# Patient Record
Sex: Female | Born: 1935 | Race: Black or African American | Hispanic: No | Smoking: Never smoker
Health system: Southern US, Community
[De-identification: ages and names within clinical notes are randomized; demographics above are authoritative.]

## PROBLEM LIST (undated history)

## (undated) DIAGNOSIS — D649 Anemia, unspecified: Secondary | ICD-10-CM

## (undated) DIAGNOSIS — I10 Essential (primary) hypertension: Secondary | ICD-10-CM

## (undated) DIAGNOSIS — E7800 Pure hypercholesterolemia, unspecified: Secondary | ICD-10-CM

## (undated) DIAGNOSIS — E119 Type 2 diabetes mellitus without complications: Secondary | ICD-10-CM

## (undated) DIAGNOSIS — Z923 Personal history of irradiation: Secondary | ICD-10-CM

## (undated) DIAGNOSIS — H269 Unspecified cataract: Secondary | ICD-10-CM

## (undated) DIAGNOSIS — C50919 Malignant neoplasm of unspecified site of unspecified female breast: Secondary | ICD-10-CM

## (undated) DIAGNOSIS — H409 Unspecified glaucoma: Secondary | ICD-10-CM

## (undated) DIAGNOSIS — Z9221 Personal history of antineoplastic chemotherapy: Secondary | ICD-10-CM

## (undated) HISTORY — PX: APPENDECTOMY: SHX54

## (undated) HISTORY — PX: ABDOMINAL HYSTERECTOMY: SHX81

## (undated) HISTORY — PX: BREAST LUMPECTOMY: SHX2

## (undated) HISTORY — DX: Anemia, unspecified: D64.9

## (undated) HISTORY — DX: Unspecified cataract: H26.9

## (undated) HISTORY — DX: Unspecified glaucoma: H40.9

## (undated) HISTORY — PX: EYE SURGERY: SHX253

---

## 1998-02-06 ENCOUNTER — Ambulatory Visit (HOSPITAL_COMMUNITY): Admission: RE | Admit: 1998-02-06 | Discharge: 1998-02-06 | Payer: Self-pay | Admitting: Cardiology

## 1998-06-04 ENCOUNTER — Ambulatory Visit (HOSPITAL_COMMUNITY): Admission: RE | Admit: 1998-06-04 | Discharge: 1998-06-04 | Payer: Self-pay | Admitting: Obstetrics & Gynecology

## 1998-11-29 ENCOUNTER — Emergency Department (HOSPITAL_COMMUNITY): Admission: EM | Admit: 1998-11-29 | Discharge: 1998-11-29 | Payer: Self-pay | Admitting: Emergency Medicine

## 1998-11-29 ENCOUNTER — Other Ambulatory Visit: Admission: RE | Admit: 1998-11-29 | Discharge: 1998-11-29 | Payer: Self-pay | Admitting: Obstetrics and Gynecology

## 1998-11-30 ENCOUNTER — Encounter: Payer: Self-pay | Admitting: Emergency Medicine

## 1999-07-07 HISTORY — PX: BREAST LUMPECTOMY: SHX2

## 1999-07-18 ENCOUNTER — Encounter: Payer: Self-pay | Admitting: Obstetrics and Gynecology

## 1999-07-18 ENCOUNTER — Encounter: Admission: RE | Admit: 1999-07-18 | Discharge: 1999-07-18 | Payer: Self-pay | Admitting: Obstetrics and Gynecology

## 2000-03-11 ENCOUNTER — Other Ambulatory Visit: Admission: RE | Admit: 2000-03-11 | Discharge: 2000-03-11 | Payer: Self-pay | Admitting: Obstetrics and Gynecology

## 2000-03-15 ENCOUNTER — Other Ambulatory Visit: Admission: RE | Admit: 2000-03-15 | Discharge: 2000-03-15 | Payer: Self-pay | Admitting: Obstetrics and Gynecology

## 2000-03-15 ENCOUNTER — Encounter (INDEPENDENT_AMBULATORY_CARE_PROVIDER_SITE_OTHER): Payer: Self-pay | Admitting: *Deleted

## 2000-03-15 ENCOUNTER — Encounter: Payer: Self-pay | Admitting: Obstetrics and Gynecology

## 2000-03-15 ENCOUNTER — Encounter: Admission: RE | Admit: 2000-03-15 | Discharge: 2000-03-15 | Payer: Self-pay | Admitting: Obstetrics and Gynecology

## 2000-03-31 ENCOUNTER — Encounter (HOSPITAL_BASED_OUTPATIENT_CLINIC_OR_DEPARTMENT_OTHER): Payer: Self-pay | Admitting: General Surgery

## 2000-04-02 ENCOUNTER — Encounter (HOSPITAL_BASED_OUTPATIENT_CLINIC_OR_DEPARTMENT_OTHER): Payer: Self-pay | Admitting: General Surgery

## 2000-04-02 ENCOUNTER — Ambulatory Visit (HOSPITAL_COMMUNITY): Admission: RE | Admit: 2000-04-02 | Discharge: 2000-04-02 | Payer: Self-pay | Admitting: General Surgery

## 2000-05-04 ENCOUNTER — Encounter: Payer: Self-pay | Admitting: *Deleted

## 2000-05-04 ENCOUNTER — Ambulatory Visit (HOSPITAL_COMMUNITY): Admission: RE | Admit: 2000-05-04 | Discharge: 2000-05-04 | Payer: Self-pay | Admitting: *Deleted

## 2000-05-06 ENCOUNTER — Encounter: Payer: Self-pay | Admitting: *Deleted

## 2000-05-06 ENCOUNTER — Ambulatory Visit (HOSPITAL_COMMUNITY): Admission: RE | Admit: 2000-05-06 | Discharge: 2000-05-06 | Payer: Self-pay | Admitting: *Deleted

## 2000-05-12 ENCOUNTER — Ambulatory Visit (HOSPITAL_COMMUNITY): Admission: RE | Admit: 2000-05-12 | Discharge: 2000-05-12 | Payer: Self-pay | Admitting: General Surgery

## 2000-05-12 ENCOUNTER — Encounter (HOSPITAL_BASED_OUTPATIENT_CLINIC_OR_DEPARTMENT_OTHER): Payer: Self-pay | Admitting: General Surgery

## 2000-05-13 ENCOUNTER — Ambulatory Visit (HOSPITAL_COMMUNITY): Admission: RE | Admit: 2000-05-13 | Discharge: 2000-05-13 | Payer: Self-pay | Admitting: *Deleted

## 2000-05-13 ENCOUNTER — Encounter: Payer: Self-pay | Admitting: *Deleted

## 2000-07-06 DIAGNOSIS — C50919 Malignant neoplasm of unspecified site of unspecified female breast: Secondary | ICD-10-CM

## 2000-07-06 HISTORY — DX: Malignant neoplasm of unspecified site of unspecified female breast: C50.919

## 2000-08-05 ENCOUNTER — Encounter: Admission: RE | Admit: 2000-08-05 | Discharge: 2000-11-03 | Payer: Self-pay | Admitting: Cardiology

## 2000-09-30 ENCOUNTER — Ambulatory Visit (HOSPITAL_COMMUNITY): Admission: RE | Admit: 2000-09-30 | Discharge: 2000-09-30 | Payer: Self-pay | Admitting: Cardiology

## 2000-09-30 ENCOUNTER — Encounter: Payer: Self-pay | Admitting: Cardiology

## 2000-10-19 ENCOUNTER — Encounter: Admission: RE | Admit: 2000-10-19 | Discharge: 2000-10-19 | Payer: Self-pay | Admitting: *Deleted

## 2000-10-19 ENCOUNTER — Encounter: Payer: Self-pay | Admitting: *Deleted

## 2000-11-26 ENCOUNTER — Ambulatory Visit (HOSPITAL_COMMUNITY): Admission: RE | Admit: 2000-11-26 | Discharge: 2000-11-26 | Payer: Self-pay | Admitting: General Surgery

## 2000-12-08 ENCOUNTER — Ambulatory Visit: Admission: RE | Admit: 2000-12-08 | Discharge: 2001-03-08 | Payer: Self-pay | Admitting: Radiation Oncology

## 2001-03-30 ENCOUNTER — Encounter: Admission: RE | Admit: 2001-03-30 | Discharge: 2001-03-30 | Payer: Self-pay | Admitting: *Deleted

## 2001-03-30 ENCOUNTER — Encounter: Payer: Self-pay | Admitting: *Deleted

## 2001-04-07 ENCOUNTER — Other Ambulatory Visit: Admission: RE | Admit: 2001-04-07 | Discharge: 2001-04-07 | Payer: Self-pay | Admitting: Obstetrics and Gynecology

## 2001-06-24 ENCOUNTER — Ambulatory Visit (HOSPITAL_BASED_OUTPATIENT_CLINIC_OR_DEPARTMENT_OTHER): Admission: RE | Admit: 2001-06-24 | Discharge: 2001-06-24 | Payer: Self-pay | Admitting: Urology

## 2001-08-02 ENCOUNTER — Encounter (INDEPENDENT_AMBULATORY_CARE_PROVIDER_SITE_OTHER): Payer: Self-pay | Admitting: *Deleted

## 2001-08-02 ENCOUNTER — Ambulatory Visit (HOSPITAL_COMMUNITY): Admission: RE | Admit: 2001-08-02 | Discharge: 2001-08-02 | Payer: Self-pay | Admitting: Gastroenterology

## 2001-10-24 ENCOUNTER — Encounter: Payer: Self-pay | Admitting: *Deleted

## 2001-10-24 ENCOUNTER — Encounter: Admission: RE | Admit: 2001-10-24 | Discharge: 2001-10-24 | Payer: Self-pay | Admitting: *Deleted

## 2002-04-13 ENCOUNTER — Encounter: Admission: RE | Admit: 2002-04-13 | Discharge: 2002-04-13 | Payer: Self-pay | Admitting: Cardiology

## 2002-04-13 ENCOUNTER — Encounter: Payer: Self-pay | Admitting: Cardiology

## 2002-10-27 ENCOUNTER — Encounter: Payer: Self-pay | Admitting: *Deleted

## 2002-10-27 ENCOUNTER — Encounter: Admission: RE | Admit: 2002-10-27 | Discharge: 2002-10-27 | Payer: Self-pay | Admitting: *Deleted

## 2003-02-15 ENCOUNTER — Encounter: Admission: RE | Admit: 2003-02-15 | Discharge: 2003-02-15 | Payer: Self-pay | Admitting: Cardiology

## 2003-02-15 ENCOUNTER — Encounter: Payer: Self-pay | Admitting: Cardiology

## 2003-09-19 ENCOUNTER — Ambulatory Visit (HOSPITAL_COMMUNITY): Admission: RE | Admit: 2003-09-19 | Discharge: 2003-09-19 | Payer: Self-pay | Admitting: Oncology

## 2003-11-02 ENCOUNTER — Encounter: Admission: RE | Admit: 2003-11-02 | Discharge: 2003-11-02 | Payer: Self-pay | Admitting: Oncology

## 2004-09-16 ENCOUNTER — Ambulatory Visit: Payer: Self-pay | Admitting: Oncology

## 2004-11-03 ENCOUNTER — Encounter: Admission: RE | Admit: 2004-11-03 | Discharge: 2004-11-03 | Payer: Self-pay | Admitting: General Surgery

## 2005-03-18 ENCOUNTER — Ambulatory Visit: Payer: Self-pay | Admitting: Oncology

## 2005-06-02 ENCOUNTER — Encounter: Admission: RE | Admit: 2005-06-02 | Discharge: 2005-06-02 | Payer: Self-pay | Admitting: Cardiology

## 2005-09-23 ENCOUNTER — Ambulatory Visit: Payer: Self-pay | Admitting: Oncology

## 2005-11-05 ENCOUNTER — Encounter: Admission: RE | Admit: 2005-11-05 | Discharge: 2005-11-05 | Payer: Self-pay | Admitting: Oncology

## 2006-02-23 ENCOUNTER — Observation Stay (HOSPITAL_COMMUNITY): Admission: EM | Admit: 2006-02-23 | Discharge: 2006-02-23 | Payer: Self-pay | Admitting: Emergency Medicine

## 2006-09-09 ENCOUNTER — Ambulatory Visit (HOSPITAL_BASED_OUTPATIENT_CLINIC_OR_DEPARTMENT_OTHER): Admission: RE | Admit: 2006-09-09 | Discharge: 2006-09-09 | Payer: Self-pay | Admitting: Cardiology

## 2006-09-12 ENCOUNTER — Ambulatory Visit: Payer: Self-pay | Admitting: Internal Medicine

## 2006-09-21 ENCOUNTER — Ambulatory Visit: Payer: Self-pay | Admitting: Oncology

## 2006-10-02 ENCOUNTER — Emergency Department (HOSPITAL_COMMUNITY): Admission: EM | Admit: 2006-10-02 | Discharge: 2006-10-02 | Payer: Self-pay | Admitting: Emergency Medicine

## 2006-10-05 LAB — LACTATE DEHYDROGENASE: LDH: 158 U/L (ref 94–250)

## 2006-10-05 LAB — CBC WITH DIFFERENTIAL (CANCER CENTER ONLY)
BASO#: 0 10*3/uL (ref 0.0–0.2)
Eosinophils Absolute: 0.1 10*3/uL (ref 0.0–0.5)
HGB: 12.3 g/dL (ref 11.6–15.9)
MONO#: 0.4 10*3/uL (ref 0.1–0.9)
NEUT#: 3.3 10*3/uL (ref 1.5–6.5)
Platelets: 242 10*3/uL (ref 145–400)
RBC: 4.08 10*6/uL (ref 3.70–5.32)
WBC: 5.5 10*3/uL (ref 3.9–10.0)

## 2006-10-05 LAB — COMPREHENSIVE METABOLIC PANEL
ALT: 15 U/L (ref 0–35)
Albumin: 3.8 g/dL (ref 3.5–5.2)
CO2: 29 mEq/L (ref 19–32)
Calcium: 10.2 mg/dL (ref 8.4–10.5)
Chloride: 104 mEq/L (ref 96–112)
Glucose, Bld: 175 mg/dL — ABNORMAL HIGH (ref 70–99)
Potassium: 4.9 mEq/L (ref 3.5–5.3)
Sodium: 143 mEq/L (ref 135–145)
Total Bilirubin: 0.9 mg/dL (ref 0.3–1.2)
Total Protein: 7.1 g/dL (ref 6.0–8.3)

## 2006-10-05 LAB — CANCER ANTIGEN 27.29: CA 27.29: 26 U/mL (ref 0–39)

## 2006-11-04 ENCOUNTER — Encounter (HOSPITAL_COMMUNITY): Admission: RE | Admit: 2006-11-04 | Discharge: 2006-11-05 | Payer: Self-pay | Admitting: Internal Medicine

## 2006-11-08 ENCOUNTER — Encounter: Admission: RE | Admit: 2006-11-08 | Discharge: 2006-11-08 | Payer: Self-pay | Admitting: General Surgery

## 2007-04-18 ENCOUNTER — Encounter: Admission: RE | Admit: 2007-04-18 | Discharge: 2007-04-18 | Payer: Self-pay | Admitting: Cardiology

## 2007-10-04 ENCOUNTER — Ambulatory Visit: Payer: Self-pay | Admitting: Oncology

## 2007-10-05 LAB — CBC WITH DIFFERENTIAL (CANCER CENTER ONLY)
BASO#: 0 10*3/uL (ref 0.0–0.2)
EOS%: 1.4 % (ref 0.0–7.0)
Eosinophils Absolute: 0.1 10*3/uL (ref 0.0–0.5)
HGB: 11.7 g/dL (ref 11.6–15.9)
LYMPH#: 1.6 10*3/uL (ref 0.9–3.3)
MCH: 29.3 pg (ref 26.0–34.0)
MCHC: 32.9 g/dL (ref 32.0–36.0)
MONO%: 7.2 % (ref 0.0–13.0)
NEUT#: 3.2 10*3/uL (ref 1.5–6.5)
Platelets: 268 10*3/uL (ref 145–400)
RBC: 4 10*6/uL (ref 3.70–5.32)

## 2007-10-05 LAB — COMPREHENSIVE METABOLIC PANEL
Alkaline Phosphatase: 59 U/L (ref 39–117)
CO2: 27 mEq/L (ref 19–32)
Creatinine, Ser: 0.76 mg/dL (ref 0.40–1.20)
Glucose, Bld: 128 mg/dL — ABNORMAL HIGH (ref 70–99)
Sodium: 138 mEq/L (ref 135–145)
Total Bilirubin: 0.8 mg/dL (ref 0.3–1.2)
Total Protein: 7.4 g/dL (ref 6.0–8.3)

## 2007-10-05 LAB — CANCER ANTIGEN 27.29: CA 27.29: 28 U/mL (ref 0–39)

## 2007-11-09 ENCOUNTER — Encounter: Admission: RE | Admit: 2007-11-09 | Discharge: 2007-11-09 | Payer: Self-pay | Admitting: Oncology

## 2008-11-12 ENCOUNTER — Encounter: Admission: RE | Admit: 2008-11-12 | Discharge: 2008-11-12 | Payer: Self-pay | Admitting: Oncology

## 2009-02-14 ENCOUNTER — Ambulatory Visit: Payer: Self-pay | Admitting: Oncology

## 2009-02-14 LAB — CBC WITH DIFFERENTIAL (CANCER CENTER ONLY)
BASO#: 0 10*3/uL (ref 0.0–0.2)
BASO%: 0.5 % (ref 0.0–2.0)
EOS%: 1.1 % (ref 0.0–7.0)
HGB: 12.5 g/dL (ref 11.6–15.9)
LYMPH#: 1.5 10*3/uL (ref 0.9–3.3)
MCH: 30.2 pg (ref 26.0–34.0)
MCHC: 34.7 g/dL (ref 32.0–36.0)
MONO%: 6.1 % (ref 0.0–13.0)
NEUT#: 3.4 10*3/uL (ref 1.5–6.5)
Platelets: 235 10*3/uL (ref 145–400)
RDW: 12.5 % (ref 10.5–14.6)

## 2009-02-14 LAB — CMP (CANCER CENTER ONLY)
ALT(SGPT): 18 U/L (ref 10–47)
AST: 32 U/L (ref 11–38)
CO2: 31 mEq/L (ref 18–33)
Chloride: 100 mEq/L (ref 98–108)
Sodium: 142 mEq/L (ref 128–145)
Total Bilirubin: 0.8 mg/dl (ref 0.20–1.60)
Total Protein: 7.8 g/dL (ref 6.4–8.1)

## 2009-02-18 ENCOUNTER — Encounter: Admission: RE | Admit: 2009-02-18 | Discharge: 2009-02-18 | Payer: Self-pay | Admitting: Oncology

## 2009-03-25 ENCOUNTER — Encounter: Admission: RE | Admit: 2009-03-25 | Discharge: 2009-03-25 | Payer: Self-pay | Admitting: Cardiology

## 2009-07-04 ENCOUNTER — Encounter: Admission: RE | Admit: 2009-07-04 | Discharge: 2009-07-04 | Payer: Self-pay | Admitting: Diagnostic Neuroimaging

## 2009-11-18 ENCOUNTER — Encounter: Admission: RE | Admit: 2009-11-18 | Discharge: 2009-11-18 | Payer: Self-pay | Admitting: Oncology

## 2010-06-10 ENCOUNTER — Encounter: Admission: RE | Admit: 2010-06-10 | Discharge: 2010-06-10 | Payer: Self-pay | Admitting: Cardiology

## 2010-07-27 ENCOUNTER — Encounter: Payer: Self-pay | Admitting: Family Medicine

## 2010-11-13 ENCOUNTER — Other Ambulatory Visit: Payer: Self-pay | Admitting: Cardiology

## 2010-11-13 DIAGNOSIS — Z1231 Encounter for screening mammogram for malignant neoplasm of breast: Secondary | ICD-10-CM

## 2010-11-20 ENCOUNTER — Ambulatory Visit
Admission: RE | Admit: 2010-11-20 | Discharge: 2010-11-20 | Disposition: A | Discharge status: Home / Self Care | Payer: Medicare Other | Source: Ambulatory Visit | Attending: Cardiology | Admitting: Cardiology

## 2010-11-20 DIAGNOSIS — Z1231 Encounter for screening mammogram for malignant neoplasm of breast: Secondary | ICD-10-CM

## 2010-11-21 NOTE — Op Note (Signed)
[facility]  Patient:    [name], [name] Visit Number: [id number] MRN: [id number]          Service Type: NES Location: NESC Attending Physician:  Lindaann Slough Dictated by:   Lindaann Slough, M.D. Proc. Date: 06/24/01 Admit Date:  06/24/2001                             Operative Report  PREOPERATIVE DIAGNOSES:  Recurrent urinary tract infections, meatal stenosis.  POSTOPERATIVE DIAGNOSES:  Recurrent urinary tract infections, meatal stenosis.  PROCEDURE:  Cystoscopy and urethral dilation.  SURGEON:  Lindaann Slough, M.D.  ANESTHESIA:  General.  INDICATIONS FOR PROCEDURE:  The patient is a 77 year old female who has been complaining of frequency, hesitancy, decreased force of urinary stream and sensation of incomplete emptying of the bladder. She was treated with Macrobid without any improvement. She is scheduled for cystoscopy.  DESCRIPTION OF PROCEDURE:  Under general anesthesia, the patient was prepped and draped and placed in the dorsal lithotomy position. A #22 cystoscope could not be passed in the bladder because of meatal stenosis. The cystoscope was removed. The urethra was dilated with a #32 [place] and then a #22 cystoscope was passed in the bladder without difficulty.  The bladder mucosa is normal. There is no stone or tumor in the bladder. The ureteral orifices are in normal position and shape with clear efflux. There was no evidence of submucosal hemorrhage. The bladder was then emptied and the cystoscope removed.  The patient tolerated the procedure well and left the OR in satisfactory condition to post anesthesia care unit. Dictated by:   Lindaann Slough, M.D. Attending Physician:  Lindaann Slough DD:  06/24/01 TD:  06/25/01 Job: [id number]

## 2010-11-21 NOTE — Discharge Summary (Signed)
NAMECATHA, ONTKO             ACCOUNT NO.:  [id number]   MEDICAL RECORD NO.:  [id number]          PATIENT TYPE:  OBV   LOCATION:  4731                         FACILITY:  [facility]   PHYSICIAN:  Ricki Rodriguez, M.D.  DATE OF BIRTH:  1928/06/11   DATE OF ADMISSION:  02/23/2006  DATE OF DISCHARGE:  02/23/2006                                 DISCHARGE SUMMARY   PRINCIPAL DIAGNOSES:  1. Chest pain.  2. Status post right breast surgery.  3. Status post thyroid surgery.  4. Diabetes mellitus.  5. Hypertension.   PHYSICAL EXAMINATION:  VITAL SIGNS:  Temperature 98.3, pulse 75,  respirations 20, blood pressure 122/71, height 5 feet 1 inch, weight 151  pounds, oxygen saturation 98% on room air.  GENERAL:  The patient is average built and nourished black female in no  acute distress.  HEENT:  The patient is normocephalic, atraumatic.  NECK:  Supple.  LYMPH:  Lymphadenopathy none.  LUNGS:  Clear to auscultation bilaterally.  HEART:  Normal S1 and S2.  ABDOMEN:  Soft and nontender.  EXTREMITIES: Negative edema, cyanosis, clubbing.  NEUROLOGIC: The patient was alert and oriented times 3 with cranial nerves  II-XII are intact and moved all 4 extremities.   LABORATORY DATA:  Slightly low hemoglobin of 11.8, hematocrit 34.5, normal  WBC count, platelet count, normal electrolytes, BUN and creatinine.  Chest x-  ray no acute cardiopulmonary disease.  EKG normal sinus rhythm.  Cardiac  catheterization with normal coronaries and normal left ventricular systolic  function.   HOSPITAL COURSE:  The patient was admitted to telemetry unit.  Myocardial  infarction was ruled out because of her advanced age and typical chest pain,  the patient underwent cardiac catheterization that failed to show any  significant coronary artery disease and there was no left ventricular  sytolic dysfunction.  Hence the patient was discharged home in satisfactory  condition with followup by primary care physician, Dr.  Donia Guiles in 2  weeks and by me as needed.      Ricki Rodriguez, M.D.  Electronically Signed     ASK/MEDQ  D:  05/06/2006  T:  05/07/2006  Job:  [id number]

## 2010-11-21 NOTE — Op Note (Signed)
[place]  Patient:    [name], [name]                    MRN: [id number] Proc. Date: 04/02/00 Adm. Date:  [id number] Attending:  Sonda Primes CC:         Mardene Celeste. Lurene Shadow, M.D. (2 copies)   Operative Report  PREOPERATIVE DIAGNOSIS:  Carcinoma of the right breast.  POSTOPERATIVE DIAGNOSIS:  Carcinoma of the right breast.  OPERATION:  Lumpectomy with sentinel lymph node dissection.  SURGEON:  Mardene Celeste. Lurene Shadow, M.D.  ASSISTANT:  Nurse.  ANESTHESIA:  General.  INDICATIONS:  This patient is a 77 year old woman who presents with an abnormal right mammogram which, on core biopsy, shows infiltrating ductal carcinoma.  She is brought to the operating room now following needle localization and radio nucleotide injection for lumpectomy and sentinel lymph node biopsy.  DESCRIPTION OF PROCEDURE:  Following induction of anesthesia with the patient positioned supinely, the region around the localizing needle is infiltrated with ______ dye into the subdermal tissues.  The breast is then massaged for approximately 10 minutes and then prepped and draped to be included in the sterile operative field.  The location of the lesion was rather high in the axillary tail.  I made an elliptical incision around the localizing needle, taking a large wedge of breast tissue down to the chest wall, excising the entire lesion and forwarding it for specimen radiography.  The lesion was well contained within the specimen.  The specimen was then forwarded for touch touch prep and touch prep markings were negative.  Using the neoprobe as a guide, I located the region of a sentinel lymph node and dissected down upon it through the same incision where I found a large blue node which was radioactively hot.  This was dissected from the surrounding tissues and forwarded for pathologic evaluation.  Touch prep of the lymph node was negative for tumor.  There was no  evidence of any additional hot nodes within the axilla nor were there any other blue nodes noted.  All areas of dissection within the wound were then checked for hemostasis.  Additional bleeding points were treated with electrocautery.  Sponge, instrument, and sharp counts verified.  Subcutaneous tissues were then closed with interrupted 3-0 Vicryl sutures, and the skin was closed with a 5-0 Monocryl running subcuticular stitch.  The wound was then reinforced with Steri-Strips.  Sterile dressing was applied.  The anesthetic was reversed.  The patient was moved from the operating room to the recovery room in stable condition having tolerated the procedure well. DD:  04/02/00 TD:  04/02/00 Job: [id number]

## 2010-11-21 NOTE — Cardiovascular Report (Signed)
[name], [name]             ACCOUNT NO.:  [id number]   MEDICAL RECORD NO.:  [id number]          PATIENT TYPE:  OBV   LOCATION:  4731                         FACILITY:  [facility]   PHYSICIAN:  Ricki Rodriguez, M.D.  DATE OF BIRTH:  1928/01/06   DATE OF PROCEDURE:  02/23/2006  DATE OF DISCHARGE:                              CARDIAC CATHETERIZATION   REFERRING PHYSICIAN:  Osvaldo Shipper. Spruill, M.D.   PROCEDURE:  Left heart catheterization, selective coronary angiography, left  ventricular function study.   INDICATIONS:  This 77 year old black female had typical chest pain radiating  to neck and jaw area.   APPROACH:  Right femoral artery using 4-French sheath.   COMPLICATIONS:  None.   HEMODYNAMIC DATA:  The left ventricular pressure was 130/14, and aortic  pressure was 136/65.   CORONARY ANATOMY:  The left main coronary artery was short and unremarkable.   Left anterior descending coronary artery:  The left anterior descending  coronary artery was unremarkable, but it wrapped around the apex of the  heart, applying more than half of the posterior septum. The diagonal vessel  was unremarkable.   Left circumflex coronary artery:  The left circumflex coronary artery was  unremarkable, and it has a large OM branch.   Right coronary artery:  The right coronary artery was dominant but had a  very small posterior descending coronary artery, and its posterolateral  branch and marginal branch were okay.   Left ventriculogram:  The left ventriculogram showed normal left ventricular  systolic function with ejection fraction of 65%.   IMPRESSION:  1. Normal coronaries.  2. Normal left ventricular systolic function.   PLAN:  Treat this patient medically and consider noncardiac chest pain  evaluation on an outpatient basis.     Ricki Rodriguez, M.D.  Electronically Signed    ASK/MEDQ  D:  02/23/2006  T:  02/24/2006  Job:  [id number]

## 2010-11-21 NOTE — Procedures (Signed)
[place]  Patient:    [name], [name] Visit Number: [id number] MRN: [id number]          Service Type: END Location: ENDO Attending Physician:  Orland Mustard Dictated by:   Llana Aliment. Randa Evens, M.D. Proc. Date: 08/02/01 Admit Date:  08/02/2001 Discharge Date: 08/02/2001   CC:         Osvaldo Shipper. Spruill, M.D.                           Procedure Report  DATE OF BIRTH:  10/26/1927  PROCEDURE PERFORMED:  Colonoscopy and polypectomy.  ENDOSCOPIST:  Llana Aliment. Randa Evens, M.D.  MEDICATIONS USED:  Fentanyl 80 mcg, Versed 7.5 mcg IV.  INSTRUMENT:  Pediatric video colonoscope.  INDICATIONS:  Heme positive stool.  DESCRIPTION OF PROCEDURE:  The procedure had been explained to the patient and consent obtained.  With the patient in the left lateral decubitus position, the Olympus pediatric video colonoscope was inserted and advanced under direct visualization.  The prep was excellent.  After abdominal pressure and position changes we were finally able to reach the cecum.  The ileocecal valve and appendiceal orifice were identified.  The scope was withdrawn.  The cecum, ascending colon were seen.  In the middle of the ascending colon, a 0.5 cm sessile polyp was removed with a snare and sucked through the scope.  There was no bleeding.  There were scattered diverticula throughout the entire colon including the right colon, transverse colon, descending and left colon.  No polyps were seen in these areas.  The sigmoid did have a fair amount of diverticulosis present. In the rectum near the rectosigmoid junction a 3 to 4 mm sessile polyp was encountered and was cauterized.  This was placed in jar #2.  Scope withdrawn, patient tolerated the procedure well.  Maintained on low flow oxygen and pulse oximeter throughout the procedure. ASSESSMENT: 1. Polyp in ascending colon and rectum. 2. Pandiverticulosis.  PLAN:  Routine postpolypectomy  instructions.  Will recommend repeating in two years and see back in the office in two months to recheck her stool. Dictated by:   Llana Aliment. Randa Evens, M.D. Attending Physician:  Orland Mustard DD:  08/02/01 TD:  08/02/01 Job: [id number]

## 2010-11-21 NOTE — Procedures (Signed)
[name], [name]             ACCOUNT NO.:  [id number]   MEDICAL RECORD NO.:  [id number]          PATIENT TYPE:  OUT   LOCATION:  SLEEP CENTER                 [facility]ACILITY[facility]  [facility]   PHYSICIAN:  Clinton D. Maple Hudson, MD, FCCP, FACPDATE OF BIRTH:  09-17[birth year]1929   DATE OF STUDY:                            NOCTURNAL POLYSOMNOGRAM   INDICATION FOR STUDY:  Hypersomnia with sleep apnea.   EPWORTH SLEEPINESS SCORE:  6/24. Height 5 feet, 3 inches, weight 156  pounds.   MEDICATIONS:  Diovan HCT.   SLEEP ARCHITECTURE:  Total sleep time 278 minutes with sleep efficiency  66%.  Stage I was 11%, stage II 82%, stages III and IV absent. REM 8% of  total sleep time.  Sleep latency 36 minutes, REM latency 302 minutes, awake after sleep  onset 110 minutes, arousal index 8. No bedtime medication was taken.   RESPIRATORY DATA:  Split study protocol. Apnea/hypopnea index (AHI, RDI)  31.4 obstructive events per hour indicating moderate obstructive sleep  apnea/hypopnea syndrome before CPAP. There were 7 obstructive apnea's  and 73 hypopnea's before CPAP. Events were positional, mostly associated  with supine sleep position. REM AHI 22.3.  CPAP was titrated to 8 CWP,  AHI 4.1 per hour. A small full face Mirage Quattro mask was used with  heated humidifier.   OXYGEN DATA:  Moderate snoring with oxygen desaturation to a nadir of  84%.  After CPAP control saturation held at 95% on room air.   CARDIAC DATA:  Normal sinus rhythm, occasional PAC.   MOVEMENT-PARASOMNIA:  Occasional limb jerk, insignificant.   IMPRESSIONS-RECOMMENDATIONS:  1. Moderate obstructive sleep apnea/hypopnea syndrome, AHI 31.4 per      hour with positional events mostly while supine. Moderate snoring      with oxygen desaturation to a nadir of 84%.  2. Successful CPAP titration to 8 CWP, AHI 4.1 per hour. A small      ResMed full face Mirage Quattro mask was used with heated      humidifier.      Clinton D. Maple Hudson, MD, [facility], FACP  Diplomate, Biomedical engineer of Sleep Medicine  Electronically Signed     CDY/MEDQ  D:  09/12/2006 12:09:10  T:  09/12/2006 20:15:43  Job:  [id number]

## 2010-11-21 NOTE — Op Note (Signed)
[place] [place]  Patient:    [name], [name]                    MRN: [id number] Proc. Date: 05/12/00 Adm. Date:  [id number] Attending:  Sonda Primes CC:         Mardene Celeste. Lurene Shadow, M.D.   Operative Report  PREOPERATIVE DIAGNOSIS: 1. Poor venous access. 2. Carcinoma of right breast.  POSTOPERATIVE DIAGNOSIS: 1. Poor venous access. 2. Carcinoma of right breast.  OPERATION PERFORMED:  Port-A-Cath implantation.  SURGEON:  Mardene Celeste. Lurene Shadow, M.D.  ASSISTANT:  Nurse.  ANESTHESIA:  MAC.  1% Xylocaine with and without epinephrine.  INDICATIONS FOR PROCEDURE:  The patient is a 77 year old woman with recently diagnosed right-sided breast cancer, who is going to be requiring chemotherapy.  Because of her poor venous access, she is now brought to the operating room for implantation of a venous access device.  DESCRIPTION OF PROCEDURE:  Following the induction of satisfactory sedation and patient placed in Trendelenburg position, the left anterior chest and neck were prepped and draped to be included in a sterile operative field. Infiltration of the left subclavian region with 1% Xylocaine plain.  I then made a stick into the left subclavian vein and threaded a guide wire into the central venous system.  Fluoroscopic control of the guide wire showed that it was in the right atrium.  We then created a pocket on the anterior chest wall and from the pocket, the tunnel up to the shoulder wound, through which a Silastic catheter was pulled.  The size 10 Cook introducer dilator was placed over the guide wire and put into the central venous system and positioned in the right atrium.  The dilator and guide wire removed and the Silastic catheter was threaded into the central venous system and positioned at the atriovena caval junction.  Inflow of heparinized saline and blood return noted to be excellent.  The external portion of the Silastic catheter  was then cut and the reservoir attached to the Silastic catheter and locked in place.  The reservoir then seated within the pocket.  Inflow of heparinized saline and return of blood through the reservoir was excellent.  The reservoir was then sutured into the pocket with 2-0 silk sutures.  Sponge, instrument and sharp counts were verified.  Final fluoroscopic evaluation of the placement showed no kinks, bends or unusual turns within the course of the Port-A-Cath.  The wounds were then closed in layers as follows.  The pocket was closed in two layers with 3-0 Vicryl and a 5-0 Monocryl suture and the shoulder wound was similarly closed with 3-0 Vicryl and a 5-0 Monocryl suture.  The wounds were reinforced with Steri-Strips.  Sterile dressings were applied after concentrated heparin flush was placed into the reservoir.  Sterile dressings were applied.  Anesthetic reversed.  Patient removed from the operating room to the recovery room in stable condition having tolerated the procedure well. DD:  05/12/00 TD:  05/12/00 Job: [id number]

## 2010-11-26 ENCOUNTER — Other Ambulatory Visit: Payer: Self-pay | Admitting: Cardiology

## 2010-11-26 DIAGNOSIS — R928 Other abnormal and inconclusive findings on diagnostic imaging of breast: Secondary | ICD-10-CM

## 2010-11-28 ENCOUNTER — Ambulatory Visit
Admission: RE | Admit: 2010-11-28 | Discharge: 2010-11-28 | Disposition: A | Discharge status: Home / Self Care | Payer: Medicare Other | Source: Ambulatory Visit | Attending: Cardiology | Admitting: Cardiology

## 2010-11-28 DIAGNOSIS — R928 Other abnormal and inconclusive findings on diagnostic imaging of breast: Secondary | ICD-10-CM

## 2010-12-03 ENCOUNTER — Emergency Department (HOSPITAL_COMMUNITY)
Admission: EM | Admit: 2010-12-03 | Discharge: 2010-12-03 | Disposition: A | Discharge status: Home / Self Care | Payer: Medicare Other | Attending: Emergency Medicine | Admitting: Emergency Medicine

## 2010-12-03 ENCOUNTER — Emergency Department (HOSPITAL_COMMUNITY): Payer: Medicare Other

## 2010-12-03 DIAGNOSIS — M79609 Pain in unspecified limb: Secondary | ICD-10-CM | POA: Insufficient documentation

## 2010-12-03 DIAGNOSIS — K2970 Gastritis, unspecified, without bleeding: Secondary | ICD-10-CM | POA: Insufficient documentation

## 2010-12-03 DIAGNOSIS — K2990 Gastroduodenitis, unspecified, without bleeding: Secondary | ICD-10-CM | POA: Insufficient documentation

## 2010-12-03 DIAGNOSIS — E785 Hyperlipidemia, unspecified: Secondary | ICD-10-CM | POA: Insufficient documentation

## 2010-12-03 DIAGNOSIS — I10 Essential (primary) hypertension: Secondary | ICD-10-CM | POA: Insufficient documentation

## 2010-12-03 LAB — CBC
HCT: 36.1 % (ref 36.0–46.0)
Hemoglobin: 12.1 g/dL (ref 12.0–15.0)
MCH: 29.6 pg (ref 26.0–34.0)
MCV: 88.3 fL (ref 78.0–100.0)
Platelets: 231 10*3/uL (ref 150–400)
RBC: 4.09 MIL/uL (ref 3.87–5.11)

## 2010-12-03 LAB — DIFFERENTIAL
Eosinophils Absolute: 0.1 10*3/uL (ref 0.0–0.7)
Lymphs Abs: 2.2 10*3/uL (ref 0.7–4.0)
Monocytes Relative: 7 % (ref 3–12)
Neutrophils Relative %: 64 % (ref 43–77)

## 2010-12-03 LAB — POCT I-STAT, CHEM 8
BUN: 29 mg/dL — ABNORMAL HIGH (ref 6–23)
Calcium, Ion: 1.15 mmol/L (ref 1.12–1.32)
Chloride: 104 mEq/L (ref 96–112)

## 2010-12-03 LAB — TROPONIN I: Troponin I: <0.3 ng/mL (ref <0.30)

## 2010-12-16 ENCOUNTER — Other Ambulatory Visit (HOSPITAL_COMMUNITY): Payer: Self-pay | Admitting: Cardiology

## 2011-01-02 ENCOUNTER — Ambulatory Visit (HOSPITAL_COMMUNITY)
Admission: RE | Admit: 2011-01-02 | Discharge: 2011-01-02 | Disposition: A | Discharge status: Home / Self Care | Payer: Medicare Other | Source: Ambulatory Visit | Attending: Cardiology | Admitting: Cardiology

## 2011-01-02 DIAGNOSIS — I059 Rheumatic mitral valve disease, unspecified: Secondary | ICD-10-CM | POA: Insufficient documentation

## 2011-01-02 DIAGNOSIS — Z8249 Family history of ischemic heart disease and other diseases of the circulatory system: Secondary | ICD-10-CM | POA: Insufficient documentation

## 2011-01-02 DIAGNOSIS — R109 Unspecified abdominal pain: Secondary | ICD-10-CM | POA: Insufficient documentation

## 2011-01-02 DIAGNOSIS — R42 Dizziness and giddiness: Secondary | ICD-10-CM | POA: Insufficient documentation

## 2011-01-02 DIAGNOSIS — R011 Cardiac murmur, unspecified: Secondary | ICD-10-CM | POA: Insufficient documentation

## 2011-01-02 DIAGNOSIS — E119 Type 2 diabetes mellitus without complications: Secondary | ICD-10-CM | POA: Insufficient documentation

## 2011-01-02 DIAGNOSIS — E7800 Pure hypercholesterolemia, unspecified: Secondary | ICD-10-CM | POA: Insufficient documentation

## 2011-01-02 DIAGNOSIS — R079 Chest pain, unspecified: Secondary | ICD-10-CM | POA: Insufficient documentation

## 2011-01-02 DIAGNOSIS — I10 Essential (primary) hypertension: Secondary | ICD-10-CM | POA: Insufficient documentation

## 2011-01-02 DIAGNOSIS — R9439 Abnormal result of other cardiovascular function study: Secondary | ICD-10-CM | POA: Insufficient documentation

## 2011-01-02 MED ORDER — TECHNETIUM TC 99M TETROFOSMIN IV KIT
10.0000 | PACK | Freq: Once | INTRAVENOUS | Status: AC | PRN
  Administered 2011-01-02: 10 via INTRAVENOUS

## 2011-01-02 MED ORDER — TECHNETIUM TC 99M TETROFOSMIN IV KIT
30.0000 | PACK | Freq: Once | INTRAVENOUS | Status: AC | PRN
  Administered 2011-01-02: 30 via INTRAVENOUS

## 2011-01-28 ENCOUNTER — Other Ambulatory Visit: Payer: Self-pay | Admitting: Oncology

## 2011-01-28 ENCOUNTER — Encounter (HOSPITAL_BASED_OUTPATIENT_CLINIC_OR_DEPARTMENT_OTHER): Payer: Medicare Other | Admitting: Oncology

## 2011-01-28 DIAGNOSIS — Z853 Personal history of malignant neoplasm of breast: Secondary | ICD-10-CM

## 2011-01-28 LAB — CBC WITH DIFFERENTIAL/PLATELET
Basophils Absolute: 0 10*3/uL (ref 0.0–0.1)
HCT: 36.7 % (ref 34.8–46.6)
HGB: 11.9 g/dL (ref 11.6–15.9)
MONO#: 0.4 10*3/uL (ref 0.1–0.9)
NEUT#: 3.9 10*3/uL (ref 1.5–6.5)
NEUT%: 62.1 % (ref 38.4–76.8)
WBC: 6.2 10*3/uL (ref 3.9–10.3)
lymph#: 1.8 10*3/uL (ref 0.9–3.3)

## 2011-01-28 LAB — COMPREHENSIVE METABOLIC PANEL
ALT: 12 U/L (ref 0–35)
Albumin: 4.1 g/dL (ref 3.5–5.2)
BUN: 18 mg/dL (ref 6–23)
CO2: 29 mEq/L (ref 19–32)
Calcium: 9.8 mg/dL (ref 8.4–10.5)
Chloride: 103 mEq/L (ref 96–112)
Creatinine, Ser: 0.89 mg/dL (ref 0.50–1.10)
Potassium: 4.5 mEq/L (ref 3.5–5.3)

## 2011-06-24 ENCOUNTER — Ambulatory Visit (INDEPENDENT_AMBULATORY_CARE_PROVIDER_SITE_OTHER): Payer: Medicare Other

## 2011-06-24 DIAGNOSIS — M545 Low back pain: Secondary | ICD-10-CM

## 2011-06-24 DIAGNOSIS — R079 Chest pain, unspecified: Secondary | ICD-10-CM

## 2011-06-24 DIAGNOSIS — I10 Essential (primary) hypertension: Secondary | ICD-10-CM

## 2011-11-17 ENCOUNTER — Other Ambulatory Visit: Payer: Self-pay | Admitting: Oncology

## 2011-11-17 DIAGNOSIS — Z1231 Encounter for screening mammogram for malignant neoplasm of breast: Secondary | ICD-10-CM

## 2011-12-01 ENCOUNTER — Ambulatory Visit: Payer: Medicare Other

## 2011-12-24 ENCOUNTER — Ambulatory Visit
Admission: RE | Admit: 2011-12-24 | Discharge: 2011-12-24 | Disposition: A | Discharge status: Home / Self Care | Payer: Medicare Other | Source: Ambulatory Visit | Attending: Oncology | Admitting: Oncology

## 2011-12-24 DIAGNOSIS — Z1231 Encounter for screening mammogram for malignant neoplasm of breast: Secondary | ICD-10-CM

## 2013-01-09 ENCOUNTER — Other Ambulatory Visit: Payer: Self-pay

## 2013-01-09 DIAGNOSIS — Z1231 Encounter for screening mammogram for malignant neoplasm of breast: Secondary | ICD-10-CM

## 2013-01-26 ENCOUNTER — Ambulatory Visit
Admission: RE | Admit: 2013-01-26 | Discharge: 2013-01-26 | Disposition: A | Discharge status: Home / Self Care | Payer: Medicare Other | Source: Ambulatory Visit

## 2013-01-26 DIAGNOSIS — Z1231 Encounter for screening mammogram for malignant neoplasm of breast: Secondary | ICD-10-CM

## 2013-03-24 ENCOUNTER — Encounter (HOSPITAL_COMMUNITY): Payer: Self-pay | Admitting: Emergency Medicine

## 2013-03-24 ENCOUNTER — Emergency Department (HOSPITAL_COMMUNITY)
Admission: EM | Admit: 2013-03-24 | Discharge: 2013-03-24 | Disposition: A | Discharge status: Home / Self Care | Payer: Medicare Other | Attending: Emergency Medicine | Admitting: Emergency Medicine

## 2013-03-24 DIAGNOSIS — Z853 Personal history of malignant neoplasm of breast: Secondary | ICD-10-CM | POA: Insufficient documentation

## 2013-03-24 DIAGNOSIS — I10 Essential (primary) hypertension: Secondary | ICD-10-CM | POA: Insufficient documentation

## 2013-03-24 DIAGNOSIS — Z79899 Other long term (current) drug therapy: Secondary | ICD-10-CM | POA: Insufficient documentation

## 2013-03-24 DIAGNOSIS — M5430 Sciatica, unspecified side: Secondary | ICD-10-CM | POA: Insufficient documentation

## 2013-03-24 DIAGNOSIS — M5432 Sciatica, left side: Secondary | ICD-10-CM

## 2013-03-24 DIAGNOSIS — E119 Type 2 diabetes mellitus without complications: Secondary | ICD-10-CM | POA: Insufficient documentation

## 2013-03-24 DIAGNOSIS — E7800 Pure hypercholesterolemia, unspecified: Secondary | ICD-10-CM | POA: Insufficient documentation

## 2013-03-24 DIAGNOSIS — Z7982 Long term (current) use of aspirin: Secondary | ICD-10-CM | POA: Insufficient documentation

## 2013-03-24 HISTORY — DX: Essential (primary) hypertension: I10

## 2013-03-24 HISTORY — DX: Malignant neoplasm of unspecified site of unspecified female breast: C50.919

## 2013-03-24 HISTORY — DX: Pure hypercholesterolemia, unspecified: E78.00

## 2013-03-24 HISTORY — DX: Type 2 diabetes mellitus without complications: E11.9

## 2013-03-24 MED ORDER — PREDNISONE 20 MG PO TABS
60.0000 mg | ORAL_TABLET | Freq: Once | ORAL | Status: AC
  Administered 2013-03-24: 60 mg via ORAL
  Filled 2013-03-24: qty 3

## 2013-03-24 MED ORDER — TRAMADOL HCL 50 MG PO TABS
50.0000 mg | ORAL_TABLET | Freq: Once | ORAL | Status: AC
  Administered 2013-03-24: 50 mg via ORAL
  Filled 2013-03-24: qty 1

## 2013-03-24 MED ORDER — PREDNISONE 20 MG PO TABS: 60.0000 mg | ORAL_TABLET | Freq: Every day | ORAL | Status: DC

## 2013-03-24 MED ORDER — TRAMADOL HCL 50 MG PO TABS: 50.0000 mg | ORAL_TABLET | Freq: Four times a day (QID) | ORAL | Status: DC | PRN

## 2013-03-24 NOTE — ED Notes (Signed)
Patient presents with an acute onset of left buttocks pain, throbbing in quality that radiates to the left foot.Onset yesterday around 6 pm. Denies any fall or injury. No weakness or numbness. No bowel or bladder incontinence. Patient ambulatory with steady gait.

## 2013-03-24 NOTE — ED Notes (Signed)
Pt. reports pain at left buttock /left leg / left foot onset yesterday denies injury or fall . Ambulatory.

## 2013-03-24 NOTE — ED Provider Notes (Signed)
CSN: [id number]     Arrival date & time 03/24/13  0139 History   First MD Initiated Contact with Patient 03/24/13 0254     Chief Complaint  Patient presents with  . Leg Pain   (Consider location/radiation/quality/duration/timing/severity/associated sxs/prior Treatment) HPI History provided by patient. Onset yesterday evening around 6 PM of the left buttocks pain, throbbing pain in quality that radiates to her left foot. She had been sitting down for a dinner presentation prior to this.  No history of same. Pain persisted today and tonight unable to sleep. Worse with palpation. No associated back pain. No weakness or numbness. No urinary or bowel incontinence. No fevers. Has been told that she has prediabetes, no medications for this.  Past Medical History  Diagnosis Date  . Hypertension   . Diabetes mellitus without complication   . High cholesterol   . Breast cancer    Past Surgical History  Procedure Laterality Date  . Breast lumpectomy     No family history on file. History  Substance Use Topics  . Smoking status: Never Smoker   . Smokeless tobacco: Not on file  . Alcohol Use: No   OB History   Grav Para Term Preterm Abortions TAB SAB Ect Mult Living                 Review of Systems  Constitutional: Negative for fever and chills.  HENT: Negative for neck pain and neck stiffness.   Eyes: Negative for pain.  Respiratory: Negative for shortness of breath.   Cardiovascular: Negative for chest pain.  Gastrointestinal: Negative for abdominal pain.  Genitourinary: Negative for dysuria.  Musculoskeletal: Negative for back pain.  Skin: Negative for rash.  Neurological: Negative for weakness and numbness.  All other systems reviewed and are negative.    Allergies  Review of patient's allergies indicates no known allergies.  Home Medications   Current Outpatient Rx  Name  Route  Sig  Dispense  Refill  . amLODipine (NORVASC) 5 MG tablet   Oral   Take 5 mg by mouth  daily.         Marland Kitchen aspirin EC 81 MG tablet   Oral   Take 81 mg by mouth daily.         . rosuvastatin (CRESTOR) 5 MG tablet   Oral   Take 5 mg by mouth daily.         . valsartan-hydrochlorothiazide (DIOVAN-HCT) 320-12.5 MG per tablet   Oral   Take 1 tablet by mouth daily.          BP 149/63  Pulse 71  Temp(Src) 98.2 F (36.8 C) (Oral)  Resp 18  Ht 5\' 1"  (1.549 m)  Wt 147 lb (66.679 kg)  BMI 27.79 kg/m2  SpO2 100% Physical Exam  Constitutional: She is oriented to person, place, and time. She appears well-developed and well-nourished.  HENT:  Head: Normocephalic and atraumatic.  Eyes: EOM are normal. Pupils are equal, round, and reactive to light.  Neck: Neck supple.  Cardiovascular: Normal pulses.   Pulses:      Radial pulses are 2+ on the right side, and 2+ on the left side.  Pulmonary/Chest: Effort normal. No respiratory distress. She has no rhonchi.  Abdominal: Soft. Normal appearance. There is no tenderness. There is no CVA tenderness and negative Murphy's sign.  Musculoskeletal: She exhibits no edema and no tenderness.  Reproducible tenderness over left sciatic region. No thoracic or lumbar tenderness or deformity. Distal neurovascular intact to lower extremities  with equal dorsi plantar flexion and sensorium to light touch throughout.  Neurological: She is alert and oriented to person, place, and time. She has normal strength. No cranial nerve deficit or sensory deficit. GCS eye subscore is 4. GCS verbal subscore is 5. GCS motor subscore is 6.  Skin: Skin is warm and dry. No rash noted. She is not diaphoretic.  Psychiatric: Her speech is normal.    ED Course  Procedures (including critical care time)  Ice, prednisone, Ultram  No lumbar pain or tenderness - no deficits or indication for emergent imaging at this time.   Plan followup with primary care physician. Return precautions provided. Prescriptions provided.   MDM  Diagnosis: Sciatica  Medications  provided  Vital signs and nursing notes reviewed and considered    Sunnie Nielsen, MD 03/24/13 [phone]

## 2014-01-01 ENCOUNTER — Other Ambulatory Visit: Payer: Self-pay

## 2014-01-01 DIAGNOSIS — Z1231 Encounter for screening mammogram for malignant neoplasm of breast: Secondary | ICD-10-CM

## 2014-01-27 ENCOUNTER — Observation Stay (HOSPITAL_COMMUNITY)
Admission: EM | Admit: 2014-01-27 | Discharge: 2014-01-28 | Disposition: A | Discharge status: Home / Self Care | Payer: Medicare HMO | Attending: Cardiology | Admitting: Cardiology

## 2014-01-27 ENCOUNTER — Observation Stay (HOSPITAL_COMMUNITY): Payer: Medicare HMO

## 2014-01-27 ENCOUNTER — Emergency Department (HOSPITAL_COMMUNITY): Payer: Medicare HMO

## 2014-01-27 ENCOUNTER — Encounter (HOSPITAL_COMMUNITY): Payer: Self-pay | Admitting: Emergency Medicine

## 2014-01-27 DIAGNOSIS — Z853 Personal history of malignant neoplasm of breast: Secondary | ICD-10-CM | POA: Insufficient documentation

## 2014-01-27 DIAGNOSIS — I10 Essential (primary) hypertension: Secondary | ICD-10-CM | POA: Diagnosis not present

## 2014-01-27 DIAGNOSIS — E119 Type 2 diabetes mellitus without complications: Secondary | ICD-10-CM | POA: Diagnosis not present

## 2014-01-27 DIAGNOSIS — Z79899 Other long term (current) drug therapy: Secondary | ICD-10-CM | POA: Diagnosis not present

## 2014-01-27 DIAGNOSIS — Z7982 Long term (current) use of aspirin: Secondary | ICD-10-CM | POA: Insufficient documentation

## 2014-01-27 DIAGNOSIS — I2510 Atherosclerotic heart disease of native coronary artery without angina pectoris: Secondary | ICD-10-CM | POA: Diagnosis not present

## 2014-01-27 DIAGNOSIS — R55 Syncope and collapse: Secondary | ICD-10-CM | POA: Diagnosis present

## 2014-01-27 DIAGNOSIS — E7800 Pure hypercholesterolemia, unspecified: Secondary | ICD-10-CM | POA: Diagnosis not present

## 2014-01-27 DIAGNOSIS — IMO0002 Reserved for concepts with insufficient information to code with codable children: Secondary | ICD-10-CM | POA: Diagnosis not present

## 2014-01-27 LAB — BASIC METABOLIC PANEL
ANION GAP: 14 (ref 5–15)
BUN: 17 mg/dL (ref 6–23)
CALCIUM: 9.5 mg/dL (ref 8.4–10.5)
CO2: 25 meq/L (ref 19–32)
Chloride: 100 mEq/L (ref 96–112)
Creatinine, Ser: 0.95 mg/dL (ref 0.50–1.10)
GFR calc Af Amer: 62 mL/min — ABNORMAL LOW (ref >90)
GFR, EST NON AFRICAN AMERICAN: 54 mL/min — AB (ref >90)
GLUCOSE: 176 mg/dL — AB (ref 70–99)
POTASSIUM: 3.8 meq/L (ref 3.7–5.3)
SODIUM: 139 meq/L (ref 137–147)

## 2014-01-27 LAB — URINALYSIS, ROUTINE W REFLEX MICROSCOPIC
BILIRUBIN URINE: NEGATIVE
Glucose, UA: NEGATIVE mg/dL
HGB URINE DIPSTICK: NEGATIVE
Ketones, ur: NEGATIVE mg/dL
NITRITE: NEGATIVE
PROTEIN: NEGATIVE mg/dL
SPECIFIC GRAVITY, URINE: 1.015 (ref 1.005–1.030)
UROBILINOGEN UA: 0.2 mg/dL (ref 0.0–1.0)
pH: 7 (ref 5.0–8.0)

## 2014-01-27 LAB — CBC WITH DIFFERENTIAL/PLATELET
BASOS ABS: 0 10*3/uL (ref 0.0–0.1)
Basophils Relative: 0 % (ref 0–1)
EOS PCT: 1 % (ref 0–5)
Eosinophils Absolute: 0.1 10*3/uL (ref 0.0–0.7)
HCT: 36 % (ref 36.0–46.0)
Hemoglobin: 11.9 g/dL — ABNORMAL LOW (ref 12.0–15.0)
LYMPHS ABS: 1.4 10*3/uL (ref 0.7–4.0)
LYMPHS PCT: 13 % (ref 12–46)
MCH: 29.9 pg (ref 26.0–34.0)
MCHC: 33.1 g/dL (ref 30.0–36.0)
MCV: 90.5 fL (ref 78.0–100.0)
Monocytes Absolute: 0.5 10*3/uL (ref 0.1–1.0)
Monocytes Relative: 5 % (ref 3–12)
NEUTROS PCT: 81 % — AB (ref 43–77)
Neutro Abs: 8.6 10*3/uL — ABNORMAL HIGH (ref 1.7–7.7)
PLATELETS: 239 10*3/uL (ref 150–400)
RBC: 3.98 MIL/uL (ref 3.87–5.11)
RDW: 14.6 % (ref 11.5–15.5)
WBC: 10.5 10*3/uL (ref 4.0–10.5)

## 2014-01-27 LAB — URINE MICROSCOPIC-ADD ON

## 2014-01-27 LAB — I-STAT TROPONIN, ED: TROPONIN I, POC: 0.01 ng/mL (ref 0.00–0.08)

## 2014-01-27 MED ORDER — INSULIN ASPART 100 UNIT/ML ~~LOC~~ SOLN: 0.0000 [IU] | Freq: Three times a day (TID) | SUBCUTANEOUS | Status: DC

## 2014-01-27 MED ORDER — IRBESARTAN 150 MG PO TABS
150.0000 mg | ORAL_TABLET | Freq: Every day | ORAL | Status: DC
  Administered 2014-01-27 – 2014-01-28 (×2): 150 mg via ORAL
  Filled 2014-01-27 (×2): qty 1

## 2014-01-27 MED ORDER — ASPIRIN EC 81 MG PO TBEC: 81.0000 mg | DELAYED_RELEASE_TABLET | Freq: Every day | ORAL | Status: DC

## 2014-01-27 MED ORDER — PANTOPRAZOLE SODIUM 40 MG PO TBEC
40.0000 mg | DELAYED_RELEASE_TABLET | Freq: Every day | ORAL | Status: DC
  Administered 2014-01-28: 40 mg via ORAL

## 2014-01-27 MED ORDER — SODIUM CHLORIDE 0.9 % IV SOLN
INTRAVENOUS | Status: DC
  Administered 2014-01-27 – 2014-01-28 (×2): via INTRAVENOUS

## 2014-01-27 MED ORDER — HEPARIN SODIUM (PORCINE) 5000 UNIT/ML IJ SOLN
5000.0000 [IU] | Freq: Three times a day (TID) | INTRAMUSCULAR | Status: DC
  Administered 2014-01-27 – 2014-01-28 (×3): 5000 [IU] via SUBCUTANEOUS
  Filled 2014-01-27 (×3): qty 1

## 2014-01-27 MED ORDER — AMLODIPINE BESYLATE 5 MG PO TABS
5.0000 mg | ORAL_TABLET | Freq: Every day | ORAL | Status: DC
Start: 2014-01-28 — End: 2014-01-28
  Administered 2014-01-28: 5 mg via ORAL
  Filled 2014-01-27: qty 1

## 2014-01-27 MED ORDER — ASPIRIN EC 81 MG PO TBEC
81.0000 mg | DELAYED_RELEASE_TABLET | Freq: Every day | ORAL | Status: DC
  Administered 2014-01-27: 81 mg via ORAL
  Filled 2014-01-27 (×2): qty 1

## 2014-01-27 NOTE — ED Notes (Signed)
PT ambulatory (ambulated to bathroom independently in ED)

## 2014-01-27 NOTE — H&P (Signed)
[name] is an 80 y.o. female.   Chief Complaint: Status post syncope HPI: Patient is 80 year old female with past medical history significant for mild coronary artery disease, hypertension, diabetes mellitus, hypercholesteremia, history of CA of breast, came to the ER by EMS following syncopal episode. Patient states she was in church after eating food suddenly felt dizzy and passed out for a few minutes. Patient denies any chest pain palpitation prior to passing out. Denies any weakness in the arms or legs. Denies any slurred speech. Denies headache. Denies any seizure activity. Patient states she did not eat anything since morning tilt 2 PM. Patient was not hypoglycemic but was noted to be orthostatic according to EMS. Patient denies such episodes in the past. Patient also complains of dry cough and mild sore throat denies any fever or chills.  Past Medical History  Diagnosis Date  . Hypertension   . Diabetes mellitus without complication   . High cholesterol   . Breast cancer     Past Surgical History  Procedure Laterality Date  . Breast lumpectomy      History reviewed. No pertinent family history. Social History:  reports that she has never smoked. She does not have any smokeless tobacco history on file. She reports that she does not drink alcohol or use illicit drugs.  Allergies: No Known Allergies   (Not in a hospital admission)  Results for orders placed during the hospital encounter of 01/27/14 (from the past 48 hour(s))  CBC WITH DIFFERENTIAL     Status: Abnormal   Collection Time    01/27/14  3:45 PM      Result Value Ref Range   WBC 10.5  4.0 - 10.5 K/uL   RBC 3.98  3.87 - 5.11 MIL/uL   Hemoglobin 11.9 (*) 12.0 - 15.0 g/dL   HCT 36.0  36.0 - 46.0 %   MCV 90.5  78.0 - 100.0 fL   MCH 29.9  26.0 - 34.0 pg   MCHC 33.1  30.0 - 36.0 g/dL   RDW 14.6  11.5 - 15.5 %   Platelets 239  150 - 400 K/uL   Neutrophils Relative % 81 (*) 43 - 77 %   Neutro Abs 8.6 (*) 1.7  - 7.7 K/uL   Lymphocytes Relative 13  12 - 46 %   Lymphs Abs 1.4  0.7 - 4.0 K/uL   Monocytes Relative 5  3 - 12 %   Monocytes Absolute 0.5  0.1 - 1.0 K/uL   Eosinophils Relative 1  0 - 5 %   Eosinophils Absolute 0.1  0.0 - 0.7 K/uL   Basophils Relative 0  0 - 1 %   Basophils Absolute 0.0  0.0 - 0.1 K/uL  BASIC METABOLIC PANEL     Status: Abnormal   Collection Time    01/27/14  3:45 PM      Result Value Ref Range   Sodium 139  137 - 147 mEq/L   Potassium 3.8  3.7 - 5.3 mEq/L   Chloride 100  96 - 112 mEq/L   CO2 25  19 - 32 mEq/L   Glucose, Bld 176 (*) 70 - 99 mg/dL   BUN 17  6 - 23 mg/dL   Creatinine, Ser 0.95  0.50 - 1.10 mg/dL   Calcium 9.5  8.4 - 10.5 mg/dL   GFR calc non Af Amer 54 (*) >90 mL/min   GFR calc Af Amer 62 (*) >90 mL/min   Comment: (NOTE)  The eGFR has been calculated using the CKD EPI equation.     This calculation has not been validated in all clinical situations.     eGFR's persistently <90 mL/min signify possible Chronic Kidney     Disease.   Anion gap 14  5 - 15  URINALYSIS, ROUTINE W REFLEX MICROSCOPIC     Status: Abnormal   Collection Time    01/27/14  3:49 PM      Result Value Ref Range   Color, Urine YELLOW  YELLOW   APPearance CLOUDY (*) CLEAR   Specific Gravity, Urine 1.015  1.005 - 1.030   pH 7.0  5.0 - 8.0   Glucose, UA NEGATIVE  NEGATIVE mg/dL   Hgb urine dipstick NEGATIVE  NEGATIVE   Bilirubin Urine NEGATIVE  NEGATIVE   Ketones, ur NEGATIVE  NEGATIVE mg/dL   Protein, ur NEGATIVE  NEGATIVE mg/dL   Urobilinogen, UA 0.2  0.0 - 1.0 mg/dL   Nitrite NEGATIVE  NEGATIVE   Leukocytes, UA SMALL (*) NEGATIVE  URINE MICROSCOPIC-ADD ON     Status: None   Collection Time    01/27/14  3:49 PM      Result Value Ref Range   Squamous Epithelial / LPF RARE  RARE   WBC, UA 0-2  <3 WBC/hpf   Urine-Other MICROSCOPIC EXAM PERFORMED ON UNCONCENTRATED URINE    I-STAT TROPOININ, ED     Status: None   Collection Time    01/27/14  4:07 PM      Result Value  Ref Range   Troponin i, poc 0.01  0.00 - 0.08 ng/mL   Comment 3            Comment: Due to the release kinetics of cTnI,     a negative result within the first hours     of the onset of symptoms does not rule out     myocardial infarction with certainty.     If myocardial infarction is still suspected,     repeat the test at appropriate intervals.   Dg Chest 2 View  01/27/2014   CLINICAL DATA:  syncope  EXAM: CHEST - 2 VIEW  COMPARISON:  12/03/2010  FINDINGS: Some mild interstitial prominence in the lung bases probably emphasized by low volumes. No focal airspace disease. Heart size normal. No effusion. Regional bones unremarkable. Atheromatous aorta.  IMPRESSION: 1. No acute disease.   Electronically Signed   By: Arne Cleveland M.D.   On: 01/27/2014 16:15    Review of Systems  Constitutional: Negative for fever and chills.  HENT: Negative for hearing loss.   Eyes: Negative for blurred vision, double vision and photophobia.  Respiratory: Positive for cough. Negative for hemoptysis, sputum production and shortness of breath.   Cardiovascular: Negative for chest pain, palpitations and orthopnea.  Gastrointestinal: Negative for nausea, vomiting, abdominal pain and diarrhea.  Genitourinary: Negative for dysuria and urgency.  Neurological: Positive for dizziness. Negative for tingling, tremors and headaches.    Blood pressure 124/79, pulse 81, temperature 97.9 F (36.6 C), temperature source Oral, resp. rate 19, height _0  (1.575 m), weight 65.772 kg (145 lb), SpO2 98.00%. Physical Exam  Constitutional: She is oriented to person, place, and time. No distress.  HENT:  Head: Normocephalic and atraumatic.  Mouth/Throat: No oropharyngeal exudate.  Eyes: Conjunctivae and EOM are normal. Pupils are equal, round, and reactive to light. Left eye exhibits no discharge. No scleral icterus.  Neck: Normal range of motion. Neck supple. No JVD present. No tracheal  deviation present. No thyromegaly  present.  Cardiovascular: Normal rate and regular rhythm.   Murmur (Systolic murmur noted no S3 gallop) heard. Respiratory: Effort normal and breath sounds normal. No respiratory distress. She has no wheezes. She has no rales.  GI: Soft. Bowel sounds are normal. She exhibits no distension.  Musculoskeletal: She exhibits no edema and no tenderness.  Neurological: She is alert and oriented to person, place, and time. No cranial nerve deficit.  Grossly intact  Skin: She is not diaphoretic.     Assessment/Plan Status post syncope probably secondary to orthostatic hypotension rule out cardiac arrhythmias Mild CAD Hypertension Diabetes mellitus Hypercholesteremia History of CA of breast Plan As per orders [facility] N 01/27/2014, 6:49 PM

## 2014-01-27 NOTE — ED Notes (Signed)
Heart healthy meal tray ordered for pt.

## 2014-01-27 NOTE — ED Notes (Addendum)
Per EMS: Pt at church function outdoors today and was sitting and slumped over, unconscious for appx 5 minutes. Ice was applied to back of neck. Pt was orthostatic in route. Pt has been feeling tired over the past few days and c/o cough and sore throat.  Dr did not determine what the cause was. Pt has no hx of passing out.

## 2014-01-27 NOTE — ED Provider Notes (Signed)
CSN: [id number]     Arrival date & time 01/27/14  1452 History   First MD Initiated Contact with Patient 01/27/14 1454     Chief Complaint  Patient presents with  . Loss of Consciousness     (Consider location/radiation/quality/duration/timing/severity/associated sxs/prior Treatment) The history is provided by the patient and medical records.   This is an 80 y.o. F with PMH significant for HTN, DM, cholesterol, breast cancer s/p lumpectomy, presenting to the ED for syncope while attending a church outdoor picnic earlier today.  She has been walking around talking to friends and fixed a plate of food.  She was sitting and eating with friends but did not get up to throw her trash away so friend did it for her but was called back when patient fainted. Patient states she remembers eating and then feeling very dizzy afterwards and that everything around her was "fading away".  States when she awoke several people we standing around her but she was unaware of what had happened.  Pt had reported orthostasis with EMS.  On arrival to ED, family states she has returned to baseline.  Patient does not that she only ate a few blueberries for breakfast this morning. She has been doing well recently, mild dry cough and sore throat but no fevers or chills and saw PCP for this 3 days ago.  No known sick contacts.  No prior cardiac hx or hx of syncope.  Denies current chest pain, SOB, palpitations, dizziness, weakness, numbness, paresthesias, confusion, tinnitus, headache, neck pain, changes in speech, or difficulty moving extremities.  Past Medical History  Diagnosis Date  . Hypertension   . Diabetes mellitus without complication   . High cholesterol   . Breast cancer    Past Surgical History  Procedure Laterality Date  . Breast lumpectomy     History reviewed. No pertinent family history. History  Substance Use Topics  . Smoking status: Never Smoker   . Smokeless tobacco: Not on file  . Alcohol Use: No    OB History   Grav Para Term Preterm Abortions TAB SAB Ect Mult Living                 Review of Systems  Neurological: Positive for syncope.  All other systems reviewed and are negative.     Allergies  Review of patient's allergies indicates no known allergies.  Home Medications   Prior to Admission medications   Medication Sig Start Date End Date Taking? Authorizing Provider  amLODipine (NORVASC) 5 MG tablet Take 5 mg by mouth daily.    Historical Provider, MD  aspirin EC 81 MG tablet Take 81 mg by mouth daily.    Historical Provider, MD  predniSONE (DELTASONE) 20 MG tablet Take 3 tablets (60 mg total) by mouth daily. 03/24/13   Teressa Lower, MD  rosuvastatin (CRESTOR) 5 MG tablet Take 5 mg by mouth daily.    Historical Provider, MD  traMADol (ULTRAM) 50 MG tablet Take 1 tablet (50 mg total) by mouth every 6 (six) hours as needed for pain. 03/24/13   Teressa Lower, MD  valsartan-hydrochlorothiazide (DIOVAN-HCT) 320-12.5 MG per tablet Take 1 tablet by mouth daily.    Historical Provider, MD   BP 145/54  Pulse 90  Temp(Src) 97.9 F (36.6 C) (Oral)  Resp 15  Ht 5\' 2"  (1.575 m)  Wt 145 lb (65.772 kg)  BMI 26.51 kg/m2  SpO2 100%  Physical Exam  Nursing note and vitals reviewed. Constitutional: She is oriented to  person, place, and time. She appears well-developed and well-nourished. No distress.  HENT:  Head: Normocephalic and atraumatic.  Mouth/Throat: Oropharynx is clear and moist.  Eyes: Conjunctivae and EOM are normal. Pupils are equal, round, and reactive to light.  Neck: Normal range of motion. Neck supple.  Cardiovascular: Normal rate, regular rhythm and normal heart sounds.   Pulmonary/Chest: Effort normal and breath sounds normal. No respiratory distress. She has no wheezes.  Abdominal: Soft. Bowel sounds are normal. There is no tenderness. There is no guarding.  Musculoskeletal: Normal range of motion. She exhibits no edema.  Neurological: She is alert and  oriented to person, place, and time.  AAOx3, answering questions appropriately; equal strength UE and LE bilaterally; CN grossly intact; moves all extremities appropriately without ataxia; no focal neuro deficits or facial asymmetry appreciated, ambulating without difficulty, no ataxia noted  Skin: Skin is warm and dry. She is not diaphoretic.  Psychiatric: She has a normal mood and affect.    ED Course  Procedures (including critical care time) Labs Review Labs Reviewed  CBC WITH DIFFERENTIAL - Abnormal; Notable for the following:    Hemoglobin 11.9 (*)    Neutrophils Relative % 81 (*)    Neutro Abs 8.6 (*)    All other components within normal limits  BASIC METABOLIC PANEL - Abnormal; Notable for the following:    Glucose, Bld 176 (*)    GFR calc non Af Amer 54 (*)    GFR calc Af Amer 62 (*)    All other components within normal limits  URINALYSIS, ROUTINE W REFLEX MICROSCOPIC - Abnormal; Notable for the following:    APPearance CLOUDY (*)    Leukocytes, UA SMALL (*)    All other components within normal limits  URINE MICROSCOPIC-ADD ON  Randolm Idol, ED    Imaging Review Dg Chest 2 View  01/27/2014   CLINICAL DATA:  syncope  EXAM: CHEST - 2 VIEW  COMPARISON:  12/03/2010  FINDINGS: Some mild interstitial prominence in the lung bases probably emphasized by low volumes. No focal airspace disease. Heart size normal. No effusion. Regional bones unremarkable. Atheromatous aorta.  IMPRESSION: 1. No acute disease.   Electronically Signed   By: Arne Cleveland M.D.   On: 01/27/2014 16:15     EKG Interpretation   Date/Time:  Saturday January 27 2014 15:02:49 EDT Ventricular Rate:  84 PR Interval:  152 QRS Duration: 82 QT Interval:  384 QTC Calculation: 454 R Axis:   -3 Text Interpretation:  Sinus rhythm Probable left atrial enlargement  Abnormal R-wave progression, early transition No significant change since  last tracing Confirmed by BEATON  MD, ROBERT ([id number]) on 01/27/2014  3:33:33  PM      MDM   Final diagnoses:  Syncope, unspecified syncope type   80 year old female with witnessed syncopal episode today while at a church cookout. No head injury noted.  Workup today is reassuring.. Orthostatic VS are appropriate. Neurologic exam is non-focal. Pt continues feeling well in the ED without complaints. Given her age and syncope of unknown origin, feel she would benefit from overnight observation.  Case was discussed with pts PCP, Dr. Terrence Dupont, who will admit for observation.  Larene Pickett, PA-C 01/27/14 2009

## 2014-01-28 DIAGNOSIS — R55 Syncope and collapse: Secondary | ICD-10-CM

## 2014-01-28 LAB — BASIC METABOLIC PANEL
Anion gap: 12 (ref 5–15)
BUN: 17 mg/dL (ref 6–23)
CALCIUM: 9.4 mg/dL (ref 8.4–10.5)
CO2: 24 mEq/L (ref 19–32)
CREATININE: 0.86 mg/dL (ref 0.50–1.10)
Chloride: 103 mEq/L (ref 96–112)
GFR calc non Af Amer: 61 mL/min — ABNORMAL LOW (ref >90)
GFR, EST AFRICAN AMERICAN: 70 mL/min — AB (ref >90)
GLUCOSE: 109 mg/dL — AB (ref 70–99)
POTASSIUM: 3.9 meq/L (ref 3.7–5.3)
Sodium: 139 mEq/L (ref 137–147)

## 2014-01-28 LAB — GLUCOSE, CAPILLARY
Glucose-Capillary: 105 mg/dL — ABNORMAL HIGH (ref 70–99)
Glucose-Capillary: 97 mg/dL (ref 70–99)
Glucose-Capillary: 110 mg/dL — ABNORMAL HIGH (ref 70–99)

## 2014-01-28 LAB — CBC
HCT: 35.2 % — ABNORMAL LOW (ref 36.0–46.0)
Hemoglobin: 11.4 g/dL — ABNORMAL LOW (ref 12.0–15.0)
MCH: 29.2 pg (ref 26.0–34.0)
MCHC: 32.4 g/dL (ref 30.0–36.0)
MCV: 90.3 fL (ref 78.0–100.0)
PLATELETS: 246 10*3/uL (ref 150–400)
RBC: 3.9 MIL/uL (ref 3.87–5.11)
RDW: 14.8 % (ref 11.5–15.5)
WBC: 9 10*3/uL (ref 4.0–10.5)

## 2014-01-28 LAB — HEMOGLOBIN A1C
HEMOGLOBIN A1C: 6.7 % — AB (ref <5.7)
Mean Plasma Glucose: 146 mg/dL — ABNORMAL HIGH (ref <117)

## 2014-01-28 MED ORDER — VALSARTAN 160 MG PO TABS: 160.0000 mg | ORAL_TABLET | Freq: Every day | ORAL | Status: DC

## 2014-01-28 NOTE — Progress Notes (Signed)
VASCULAR LAB PRELIMINARY  PRELIMINARY  PRELIMINARY  PRELIMINARY  Carotid Dopplers completed.    Preliminary report:  1-39% ICA stenosis.  Vertebral artery flow is antegrade.  Sheyla Zaffino, RVT 01/28/2014, 4:44 PM

## 2014-01-28 NOTE — Discharge Summary (Signed)
  Discharge summary dictated on 01/28/2014 dictation number is [phone]

## 2014-01-28 NOTE — ED Provider Notes (Signed)
Medical screening examination/treatment/procedure(s) were conducted as a shared visit with non-physician practitioner(s) and myself.  I personally evaluated the patient during the encounter   .Face to face Exam:  General:  A&Ox3 HEENT:  Atraumatic Resp:  Normal effort Abd:  Nondistended Neuro:No focal deficits     Dot Lanes, MD 01/28/14 1105

## 2014-01-28 NOTE — Discharge Summary (Signed)
NAMEBRANDAN, [name]             ACCOUNT NO.:  [id number]  MEDICAL RECORD NO.:  [id number]  LOCATION:  [id number]                        FACILITY:  [facility]  PHYSICIAN:  Allegra Lai. Terrence Dupont, M.D. DATE OF BIRTH:  [birth year]  DATE OF ADMISSION:  01/27/2014 DATE OF DISCHARGE:  01/28/2014                              DISCHARGE SUMMARY   ADMITTING DIAGNOSES: 1. Status post syncope, probably secondary to orthostatic hypotension.     Rule out cardiac arrhythmias. 2. Mild coronary artery disease. 3. Hypertension. 4. Diabetes mellitus, controlled by diet. 5. Hypercholesteremia. 6. History of cancer of breast.  FINAL DIAGNOSES: 1. Status post syncope, probably secondary to orthostatic hypotension,     resolved. 2. Mild coronary artery disease in the past. 3. Hypertension. 4. Diabetes mellitus. 5. Hypercholesteremia. 6. History of cancer of breast in the past.  DISCHARGE HOME MEDICATIONS: 1. Valsartan 160 mg 1 tablet daily. 2. Amlodipine 5 mg daily. 3. Aspirin 81 mg daily.  DIET:  Low salt low, cholesterol, 1800 calories ADA diet.  ACTIVITY:  As tolerated.  CONDITION AT DISCHARGE:  Stable.  FOLLOWUP:  Follow up with me in 1 week.  We will arrange for event monitor if she continues to have syncopal episode or any dizzy spells or palpitations.  CONDITION AT DISCHARGE:  Stable.  BRIEF HISTORY AND HOSPITAL COURSE:  [name] is 80 year old female with past medical history significant for mild coronary artery disease, hypertension, diabetes mellitus, hypercholesteremia, and history of cancer of the breast.  She came to the ER by EMS following syncopal episode.  The patient states she was in the church after eating foods, suddenly felt dizzy and passed out for few minutes  The patient denies any chest pain or palpitation prior to passing out, denies any weakness in the arms or legs, denies any slurred speech, denies headache, denies any seizure activity.  The patient states she did not  eat anything since morning until 2 p.m. and the  patient was not hypoglycemic but was noted to be orthostatic according to the EMS. The patient denies such episodes in the past, also complains of dry cough and mild sore throat.  Denies any fever or chills.  PAST MEDICAL HISTORY:  As above.  EXAMINATION:  GENERAL:  When seen in the ER, she was alert, awake, oriented x3. VITAL SIGNS:  Blood pressure was 124/79, pulse 81 regular. She was afebrile. HEENT:  Conjunctivae was pink. NECK:  Supple.  No JVD.  No bruit. LUNGS:  Clear to auscultation without rhonchi or rales. CARDIOVASCULAR:  S1, S2 was normal.  There was soft systolic murmur.  No S3, gallop. ABDOMEN:  Soft.  Bowel sounds were present.  Nontender. EXTREMITIES:  There is no clubbing, cyanosis, or edema. NEUROLOGIC:  Grossly intact.  LABORATORY DATA:  Sodium was 139, potassium 3.8, glucose was 146, BUN 17, creatinine 0.95.  Hemoglobin was 11.9, hematocrit 36.0, white count of 10.5.  Troponin I was negative.  Hemoglobin A1c was 6.7.  CT of the brain showed stable atrophy and chronic microvascular ischemic changes. No interval change or acute process.  Duplex carotid showed no critical stenosis in the internal carotid arteries.  2-D echo showed normal LV systolic function with grade 1 diastolic  dysfunction.  EKG showed normal sinus rhythm with no acute ischemic changes.  BRIEF HOSPITAL COURSE:  The patient was admitted to telemetry unit.  The patient did not have any episodes of syncope or dizziness during the hospital stay.  The patient received IV fluids in the ED and did not have any orthostatic hypotension during the hospital stay.  Her Valsartan/HCT has been switched to Valsartan.  The patient is ambulating in the room without any problems.  There were no significant arrhythmias on the monitor.  The patient will be discharged home on above medications and will be followed up in my office in 1 week.  If she continues to  have dizzy spells, we will arrange for event monitor as outpatient.     Allegra Lai. Terrence Dupont, M.D.     MNH/MEDQ  D:  01/28/2014  T:  01/28/2014  Job:  [id number]

## 2014-01-28 NOTE — Progress Notes (Signed)
Subjective:  Patient denies any chest pain shortness of breath. Denies any further dizziness. Denies palpitation lightheadedness or syncope. No further orthostatic hypotension noted. Workup of syncope in progress  Objective:  Vital Signs in the last 24 hours: Temp:  [97.9 F (36.6 C)-98.1 F (36.7 C)] 98.1 F (36.7 C) (07/26 0439) Pulse Rate:  [77-91] 78 (07/26 0439) Resp:  [13-24] 19 (07/26 0439) BP: (108-146)/(54-79) 125/54 mmHg (07/25 2002) SpO2:  [98 %-100 %] 100 % (07/25 2002) Weight:  [65.772 kg (145 lb)-67.3 kg (148 lb 5.9 oz)] 67.3 kg (148 lb 5.9 oz) (07/25 2020)  Intake/Output from previous day:   Intake/Output from this shift:    Physical Exam: Neck: no adenopathy, no carotid bruit, no JVD and supple, symmetrical, trachea midline Lungs: clear to auscultation bilaterally Heart: regular rate and rhythm, S1, S2 normal and Soft systolic murmur noted Abdomen: soft, non-tender; bowel sounds normal; no masses,  no organomegaly Extremities: extremities normal, atraumatic, no cyanosis or edema  Lab Results:  Recent Labs  01/27/14 1545 01/28/14 0355  WBC 10.5 9.0  HGB 11.9* 11.4*  PLT 239 246    Recent Labs  01/27/14 1545 01/28/14 0355  NA 139 139  K 3.8 3.9  CL 100 103  CO2 25 24  GLUCOSE 176* 109*  BUN 17 17  CREATININE 0.95 0.86   No results found for this basename: TROPONINI, CK, MB,  in the last 72 hours Hepatic Function Panel No results found for this basename: PROT, ALBUMIN, AST, ALT, ALKPHOS, BILITOT, BILIDIR, IBILI,  in the last 72 hours No results found for this basename: CHOL,  in the last 72 hours No results found for this basename: PROTIME,  in the last 72 hours  Imaging: Imaging results have been reviewed and Dg Chest 2 View  01/27/2014   CLINICAL DATA:  syncope  EXAM: CHEST - 2 VIEW  COMPARISON:  12/03/2010  FINDINGS: Some mild interstitial prominence in the lung bases probably emphasized by low volumes. No focal airspace disease. Heart size  normal. No effusion. Regional bones unremarkable. Atheromatous aorta.  IMPRESSION: 1. No acute disease.   Electronically Signed   By: Arne Cleveland M.D.   On: 01/27/2014 16:15    Cardiac Studies:  Assessment/Plan:  Status post syncope probably secondary to orthostatic hypotension rule out cardiac arrhythmias  Mild CAD  Hypertension  Diabetes mellitus  Hypercholesteremia  History of CA of breast Anemia probably secondary to hydration rule out GI loss Plan Check CT of brain duplex carotids and 2-D echo Check stool for occult blood Check CBC in a.m. Reduce IV fluid to KVO   LOS: 1 day    [name] N 01/28/2014, 7:47 AM

## 2014-01-28 NOTE — Progress Notes (Signed)
Utilization review completed.  

## 2014-01-28 NOTE — Progress Notes (Signed)
Patient alert and oriented, no c/o pain, or dizziness, v/s stable. Will continue to monitor patient.

## 2014-01-28 NOTE — Progress Notes (Signed)
  Echocardiogram 2D Echocardiogram has been performed.  [name] M 01/28/2014, 2:44 PM

## 2014-01-28 NOTE — Discharge Instructions (Signed)
Syncope °Syncope is a medical term for fainting or passing out. This means you lose consciousness and drop to the ground. People are generally unconscious for less than 5 minutes. You may have some muscle twitches for up to 15 seconds before waking up and returning to normal. Syncope occurs more often in older adults, but it can happen to anyone. While most causes of syncope are not dangerous, syncope can be a sign of a serious medical problem. It is important to seek medical care.  °CAUSES  °Syncope is caused by a sudden drop in blood flow to the brain. The specific cause is often not determined. Factors that can bring on syncope include: °· Taking medicines that lower blood pressure. °· Sudden changes in posture, such as standing up quickly. °· Taking more medicine than prescribed. °· Standing in one place for too long. °· Seizure disorders. °· Dehydration and excessive exposure to heat. °· Low blood sugar (hypoglycemia). °· Straining to have a bowel movement. °· Heart disease, irregular heartbeat, or other circulatory problems. °· Fear, emotional distress, seeing blood, or severe pain. °SYMPTOMS  °Right before fainting, you may: °· Feel dizzy or light-headed. °· Feel nauseous. °· See all white or all black in your field of vision. °· Have cold, clammy skin. °DIAGNOSIS  °Your health care provider will ask about your symptoms, perform a physical exam, and perform an electrocardiogram (ECG) to record the electrical activity of your heart. Your health care provider may also perform other heart or blood tests to determine the cause of your syncope which may include: °· Transthoracic echocardiogram (TTE). During echocardiography, sound waves are used to evaluate how blood flows through your heart. °· Transesophageal echocardiogram (TEE). °· Cardiac monitoring. This allows your health care provider to monitor your heart rate and rhythm in real time. °· Holter monitor. This is a portable device that records your  heartbeat and can help diagnose heart arrhythmias. It allows your health care provider to track your heart activity for several days, if needed. °· Stress tests by exercise or by giving medicine that makes the heart beat faster. °TREATMENT  °In most cases, no treatment is needed. Depending on the cause of your syncope, your health care provider may recommend changing or stopping some of your medicines. °HOME CARE INSTRUCTIONS °· Have someone stay with you until you feel stable. °· Do not drive, use machinery, or play sports until your health care provider says it is okay. °· Keep all follow-up appointments as directed by your health care provider. °· Lie down right away if you start feeling like you might faint. Breathe deeply and steadily. Wait until all the symptoms have passed. °· Drink enough fluids to keep your urine clear or pale yellow. °· If you are taking blood pressure or heart medicine, get up slowly and take several minutes to sit and then stand. This can reduce dizziness. °SEEK IMMEDIATE MEDICAL CARE IF:  °· You have a severe headache. °· You have unusual pain in the chest, abdomen, or back. °· You are bleeding from your mouth or rectum, or you have black or tarry stool. °· You have an irregular or very fast heartbeat. °· You have pain with breathing. °· You have repeated fainting or seizure-like jerking during an episode. °· You faint when sitting or lying down. °· You have confusion. °· You have trouble walking. °· You have severe weakness. °· You have vision problems. °If you fainted, call your local emergency services (911 in [place] Do not drive   yourself to the hospital.  °MAKE SURE YOU: °· Understand these instructions. °· Will watch your condition. °· Will get help right away if you are not doing well or get worse. °Document Released: 06/22/2005 Document Revised: 06/27/2013 Document Reviewed: 08/21/2011 °Exit[employer]® Patient Information ©2015 [employer]. This information is not intended to replace  advice given to you by your health care provider. Make sure you discuss any questions you have with your health care provider. ° °

## 2014-01-29 ENCOUNTER — Ambulatory Visit
Admission: RE | Admit: 2014-01-29 | Discharge: 2014-01-29 | Disposition: A | Discharge status: Home / Self Care | Payer: PRIVATE HEALTH INSURANCE | Source: Ambulatory Visit

## 2014-01-29 DIAGNOSIS — Z1231 Encounter for screening mammogram for malignant neoplasm of breast: Secondary | ICD-10-CM

## 2014-10-25 ENCOUNTER — Ambulatory Visit (INDEPENDENT_AMBULATORY_CARE_PROVIDER_SITE_OTHER): Payer: Medicare HMO | Admitting: Family Medicine

## 2014-10-25 ENCOUNTER — Ambulatory Visit (INDEPENDENT_AMBULATORY_CARE_PROVIDER_SITE_OTHER): Payer: Medicare HMO

## 2014-10-25 VITALS — BP 98/78 | HR 75 | Temp 97.7°F | Resp 16 | Ht 61.0 in | Wt 141.6 lb

## 2014-10-25 DIAGNOSIS — M25571 Pain in right ankle and joints of right foot: Secondary | ICD-10-CM | POA: Diagnosis not present

## 2014-10-25 DIAGNOSIS — E119 Type 2 diabetes mellitus without complications: Secondary | ICD-10-CM

## 2014-10-25 DIAGNOSIS — M79674 Pain in right toe(s): Secondary | ICD-10-CM | POA: Diagnosis not present

## 2014-10-25 DIAGNOSIS — D649 Anemia, unspecified: Secondary | ICD-10-CM

## 2014-10-25 DIAGNOSIS — R208 Other disturbances of skin sensation: Secondary | ICD-10-CM

## 2014-10-25 LAB — POCT CBC
GRANULOCYTE PERCENT: 64.5 % (ref 37–80)
HCT, POC: 38.8 % (ref 37.7–47.9)
Hemoglobin: 12.4 g/dL (ref 12.2–16.2)
Lymph, poc: 2.2 (ref 0.6–3.4)
MCH, POC: 29.4 pg (ref 27–31.2)
MCHC: 31.9 g/dL (ref 31.8–35.4)
MCV: 92 fL (ref 80–97)
MID (CBC): 0.2 (ref 0–0.9)
MPV: 7.2 fL (ref 0–99.8)
PLATELET COUNT, POC: 231 10*3/uL (ref 142–424)
POC GRANULOCYTE: 4.5 (ref 2–6.9)
POC LYMPH PERCENT: 32.1 %L (ref 10–50)
POC MID %: 3.4 %M (ref 0–12)
RBC: 4.22 M/uL (ref 4.04–5.48)
RDW, POC: 16.6 %
WBC: 7 10*3/uL (ref 4.6–10.2)

## 2014-10-25 LAB — POCT GLYCOSYLATED HEMOGLOBIN (HGB A1C): Hemoglobin A1C: 6

## 2014-10-25 LAB — GLUCOSE, POCT (MANUAL RESULT ENTRY): POC Glucose: 112 mg/dl — AB (ref 70–99)

## 2014-10-25 MED ORDER — GABAPENTIN 100 MG PO CAPS: 100.0000 mg | ORAL_CAPSULE | Freq: Every day | ORAL | Status: DC

## 2014-10-25 NOTE — Patient Instructions (Addendum)
You should receive a call or letter about your lab results within the next week to 10 days.  Decrease amount of walking or repetitive activity to your feet for next 2 weeks (stay active but cut back some on exercise involving feet).  Tylenol over the counter as needed. If not improving with above - can try the neurontin once at bedtime as needed. Be careful with dizziness/lightheadedness and risk of falling on this medication.  Follow up with me in next 2 weeks for your feet, and in next 3 months with primary doctor to keep an eye on your blood sugar.

## 2014-10-25 NOTE — Progress Notes (Addendum)
Subjective:    Patient ID: [name], female    DOB: May 22, 1928, 81 y.o.   MRN: [id number]  This chart was scribed for Merri Ray, MD by Stephania Fragmin, ED Scribe. This patient was seen in room 11 and the patient's care was started at 12:06 PM.   HPI Chief Complaint  Patient presents with  . Foot Pain    bilateral   HPI Comments: [name] is a 82 y.o. female who presents to the [facility] complaining of constant, bilateral sharp foot pain.  Patient is followed by Dr. Terrence Dupont. She has a history of HTN. She is also noted to have DM controlled by diet (Hemoglobin A1c of 6.7, July 2015), hyperlipidemia, mild CAD, and history of breast cancer (treated in 2000 by surgery, radiation, and chemotherapy). Patient was noted to be anemic in July 2015, with a hemoglobin of 11.9.   Patient complains of pain particularly in the distal ends of her feet, from the metatarsals to all her toes. Her right foot began having pain 2 weeks ago, and her left foot began having pain 2 days ago. She endorses associated numbness in the toes. Patient denies any known falls, trauma, or injury. She has been going to the gym 5 days a week for 3 years, walking and participating in the [employer]. Patient recently increased her exercise at the gym in the past month. She denies having had a similar pain before. She denies any extremity weakness or slurred speech. She denies numbness or tingling in her upper extremities. She denies any urinary symptoms.   Patient has been in a Diabetes-Prevention program through the [facility] for the past several months.  Patient Active Problem List   Diagnosis Date Noted  . Syncope 01/27/2014   Past Medical History  Diagnosis Date  . Hypertension   . Diabetes mellitus without complication   . High cholesterol   . Breast cancer   . Anemia   . Cataract   . Glaucoma    Past Surgical History  Procedure Laterality Date  . Breast  lumpectomy    . Appendectomy    . Eye surgery    . Abdominal hysterectomy     No Known Allergies Prior to Admission medications   Medication Sig Start Date End Date Taking? Authorizing Provider  amLODipine (NORVASC) 5 MG tablet Take 5 mg by mouth daily.   Yes Historical Provider, MD  aspirin EC 81 MG tablet Take 81 mg by mouth at bedtime.    Yes Historical Provider, MD  valsartan (DIOVAN) 160 MG tablet Take 1 tablet (160 mg total) by mouth daily. 01/28/14  Yes Charolette Forward, MD   History   Social History  . Marital Status: Married    Spouse Name: N/A  . Number of Children: N/A  . Years of Education: N/A   Occupational History  . Not on file.   Social History Main Topics  . Smoking status: Never Smoker   . Smokeless tobacco: Not on file  . Alcohol Use: No  . Drug Use: No  . Sexual Activity: No   Other Topics Concern  . Not on file   Social History Narrative    Review of Systems  Musculoskeletal: Positive for arthralgias.  Neurological: Positive for numbness.       Objective:   Physical Exam  Constitutional: She is oriented to person, place, and time. She appears well-developed and well-nourished. No distress.  HENT:  Head: Normocephalic and atraumatic.  Eyes: Conjunctivae and EOM are normal.  Neck: Neck supple. No tracheal deviation present.  Cardiovascular: Normal rate.   Pulmonary/Chest: Effort normal. No respiratory distress.  Musculoskeletal: Normal range of motion. She exhibits tenderness.  S/P bunionectomy on right foot. Well healed scar.   Right foot: Calcaneus is nontender. Plantar fascia is nontender. Navicula and fifth MT nontender. Skin is intact. No rash. Tenderness along the second toe. Slightly tender to the second MT head. Slightly tender along the plantar great toe. Negative lateral squeeze. Sensation is intact on the right foot.   Left foot: Plantar fascia is nontender. Navicula and fifth MT nontender. MT heads are nontender. Toes are nontender.  Negative lateral squeeze at MT heads. Decreased sensation in the third and fourth distal toes.   Right and left ankle have full ROM.   Neurological: She is alert and oriented to person, place, and time.  Skin: Skin is warm and dry.  Psychiatric: She has a normal mood and affect. Her behavior is normal.  Nursing note and vitals reviewed.  [facility] reading (PRIMARY) by  Dr. Carlota Raspberry: R 2nd toe: degenerative changes at 2nd toe PIP.  No apparent fx.   Results for orders placed or performed in visit on 10/25/14  POCT CBC  Result Value Ref Range   WBC 7.0 4.6 - 10.2 K/uL   Lymph, poc 2.2 0.6 - 3.4   POC LYMPH PERCENT 32.1 10 - 50 %L   MID (cbc) 0.2 0 - 0.9   POC MID % 3.4 0 - 12 %M   POC Granulocyte 4.5 2 - 6.9   Granulocyte percent 64.5 37 - 80 %G   RBC 4.22 4.04 - 5.48 M/uL   Hemoglobin 12.4 12.2 - 16.2 g/dL   HCT, POC 38.8 37.7 - 47.9 %   MCV 92.0 80 - 97 fL   MCH, POC 29.4 27 - 31.2 pg   MCHC 31.9 31.8 - 35.4 g/dL   RDW, POC 16.6 %   Platelet Count, POC 231 142 - 424 K/uL   MPV 7.2 0 - 99.8 fL  POCT glucose (manual entry)  Result Value Ref Range   POC Glucose 112 (A) 70 - 99 mg/dl  POCT glycosylated hemoglobin (Hb A1C)  Result Value Ref Range   Hemoglobin A1C 6.0       Assessment & Plan:   [name] is a 81 y.o. female Pain in joint, ankle and foot, right,Toe pain, right. Dysesthesia of multiple sites - Plan: gabapentin (NEURONTIN) 100 MG capsule  - possible diabetic neuropathy, but recent diagnosis and overall controlled on diet - less likely. Will check B12 with prior anemia (stable today).  ,metatarsalgia or neuronal impingement at this level also possible. Trial of tylenol, decrease repetitive activity to feet with exercises, trial of neurontin if this does not work - SED. rtc precautions.   Anemia, unspecified anemia type - Plan: POCT CBC, Vitamin B12  Not anemic today.  Stable.   Diabetes type 2, controlled - Plan: POCT glucose (manual entry), POCT glycosylated  hemoglobin (Hb A1C).   -follow up with PCP in 3 months. No new meds - diet control and continue activity.   Meds ordered this encounter  Medications  . gabapentin (NEURONTIN) 100 MG capsule    Sig: Take 1 capsule (100 mg total) by mouth at bedtime.    Dispense:  30 capsule    Refill:  0   Patient Instructions  You should receive a call or letter about your lab results within the next  week to 10 days.  Decrease amount of walking or repetitive activity to your feet for next 2 weeks (stay active but cut back some on exercise involving feet).  Tylenol over the counter as needed. If not improving with above - can try the neurontin once at bedtime as needed. Be careful with dizziness/lightheadedness and risk of falling on this medication.  Follow up with me in next 2 weeks for your feet, and in next 3 months with primary doctor to keep an eye on your blood sugar.       I personally performed the services described in this documentation, which was scribed in my presence. The recorded information has been reviewed and considered, and addended by me as needed.

## 2014-10-26 LAB — VITAMIN B12: Vitamin B-12: 458 pg/mL (ref 211–911)

## 2014-10-31 ENCOUNTER — Encounter: Payer: Self-pay | Admitting: Family Medicine

## 2014-11-01 ENCOUNTER — Telehealth: Payer: Self-pay

## 2014-11-01 NOTE — Telephone Encounter (Signed)
Pt LM inquiring about labs. LMOM letting her know her B12 level was normal and that we sent her a letter in the mail

## 2014-11-07 ENCOUNTER — Ambulatory Visit (INDEPENDENT_AMBULATORY_CARE_PROVIDER_SITE_OTHER): Payer: Medicare HMO

## 2014-11-07 ENCOUNTER — Ambulatory Visit: Payer: Medicare HMO

## 2014-11-07 ENCOUNTER — Ambulatory Visit (INDEPENDENT_AMBULATORY_CARE_PROVIDER_SITE_OTHER): Payer: Medicare HMO | Admitting: Podiatry

## 2014-11-07 ENCOUNTER — Encounter: Payer: Self-pay | Admitting: Podiatry

## 2014-11-07 VITALS — BP 141/73 | HR 83 | Resp 18

## 2014-11-07 DIAGNOSIS — M2040 Other hammer toe(s) (acquired), unspecified foot: Secondary | ICD-10-CM

## 2014-11-07 DIAGNOSIS — R52 Pain, unspecified: Secondary | ICD-10-CM

## 2014-11-07 DIAGNOSIS — M205X9 Other deformities of toe(s) (acquired), unspecified foot: Secondary | ICD-10-CM

## 2014-11-07 NOTE — Progress Notes (Signed)
   Subjective:    Patient ID: [name], female    DOB: Nov 30, 1927, 81 y.o.   MRN: [id number]  HPI  81 year old female presents the office today with complaints of bilateral second toe pain pain in the ball of her foot which has been ongoing for approximate one month. She states that she went to urgent care and had x-rays performed her right side. She states she gets some intermittent numbness the toes as well however is not consistent. She denies any history of injury or trauma. Denies any swelling or redness. She's had no prior treatment. No other complaints at this time.  Review of Systems  All other systems reviewed and are negative.      Objective:   Physical Exam AAO x3, NAD DP/PT pulses palpable bilaterally, CRT less than 3 seconds Protective sensation intact with Simms Weinstein monofilament, vibratory sensation intact, Achilles tendon reflex intact There is a mallet toe deformity present of bilateral second digits and there is tenderness palpation along the distal to the digit and along the DIPJ dorsally. There is prominence of the metatarsal heads plantarly with atrophy of the fat pad. There is no specific tenderness to palpation overlying the tarsal heads plantarly which other areas of the toes. There is no pain with MTPJ range of motion. No other areas of tenderness to bilateral lower extremities. MMT 5/5, ROM WNL.  No open lesions or pre-ulcerative lesions.  No overlying edema, erythema, increase in warmth to bilateral lower extremities.  No pain with calf compression, swelling, warmth, erythema bilaterally.      Assessment & Plan:  81 year old female bilateral second digit mallet toe deformity, prominent metatarsal heads -X-rays taken and reviewed with the patient.  -Treatment options were discussed the patient clean alternatives, risks, complications. -Dispensed various offloading pads to help protect the digit as well as metatarsal heads. -Discussed shoe gear  modifications. -Follow-up as needed. Call the office with any questions, concerns, changes symptoms. Follow-up in 4 weeks if the symptoms are not resolved.

## 2014-12-24 ENCOUNTER — Other Ambulatory Visit: Payer: Self-pay

## 2014-12-24 DIAGNOSIS — Z1231 Encounter for screening mammogram for malignant neoplasm of breast: Secondary | ICD-10-CM

## 2015-01-31 ENCOUNTER — Ambulatory Visit
Admission: RE | Admit: 2015-01-31 | Discharge: 2015-01-31 | Disposition: A | Discharge status: Home / Self Care | Payer: Medicare HMO | Source: Ambulatory Visit

## 2015-01-31 DIAGNOSIS — Z1231 Encounter for screening mammogram for malignant neoplasm of breast: Secondary | ICD-10-CM

## 2015-07-16 DIAGNOSIS — R252 Cramp and spasm: Secondary | ICD-10-CM | POA: Diagnosis not present

## 2015-08-12 DIAGNOSIS — H40023 Open angle with borderline findings, high risk, bilateral: Secondary | ICD-10-CM | POA: Diagnosis not present

## 2015-08-12 DIAGNOSIS — H401222 Low-tension glaucoma, left eye, moderate stage: Secondary | ICD-10-CM | POA: Diagnosis not present

## 2015-09-27 ENCOUNTER — Telehealth: Payer: Self-pay | Admitting: Emergency Medicine

## 2015-09-27 ENCOUNTER — Ambulatory Visit (INDEPENDENT_AMBULATORY_CARE_PROVIDER_SITE_OTHER): Payer: PPO | Admitting: Emergency Medicine

## 2015-09-27 ENCOUNTER — Ambulatory Visit (INDEPENDENT_AMBULATORY_CARE_PROVIDER_SITE_OTHER): Payer: PPO

## 2015-09-27 VITALS — BP 160/78 | HR 76 | Temp 98.0°F | Resp 16 | Ht 61.0 in | Wt 147.8 lb

## 2015-09-27 DIAGNOSIS — M25551 Pain in right hip: Secondary | ICD-10-CM

## 2015-09-27 DIAGNOSIS — M79604 Pain in right leg: Secondary | ICD-10-CM | POA: Diagnosis not present

## 2015-09-27 NOTE — Patient Instructions (Addendum)
IF you received an x-ray today, you will receive an invoice from [facility]. Please contact [facility] at [phone] with questions or concerns regarding your invoice.   IF you received labwork today, you will receive an invoice from [employer]. Please contact [facility] at [phone] with questions or concerns regarding your invoice.   Our billing staff will not be able to assist you with questions regarding bills from these companies.  You will be contacted with the lab results as soon as they are available. The fastest way to get your results is to activate your My Chart account. Instructions are located on the last page of this paperwork. If you have not heard from [place] regarding the results in 2 weeks, please contact this office.    Quadriceps Strain With Strain A strain is a tear in a muscle or the tendon that attaches the muscle to bone. A quadriceps strain is a tear in the muscles on the front of the thigh (quadriceps muscles) or their tendons. The quadriceps muscles are important for straightening the knee and bending the hip. The condition is characterized by pain, inflammation, and reduced function of these muscles. Strains are classified into three categories. Grade 1 strains cause pain, but the tendon is not lengthened. Grade 2 strains include a lengthened ligament due to the ligament being stretched or partially ruptured. With grade 2 strains there is still function, although the function may be diminished. Grade 3 strains are characterized by a complete tear of the tendon or muscle, and function is usually impaired.  SYMPTOMS   Pain, tenderness, inflammation, and/or bruising (contusion) over the quadriceps muscles  Pain that worsens with use of the quadriceps muscles.  Muscle spasm in the thigh.  Difficulty with common tasks that involve the quadriceps muscle, such as walking.  A crackling sound (crepitation) when the tendon  is moved or touched.  Loss of fullness of the muscle or bulging within the area of muscle with complete rupture. CAUSES  A strain occurs when a force is placed on the muscle or tendon that is greater than it can withstand. Common mechanisms of injury include:  Repetitive strenuous use of the quadriceps muscles. This may be due to an increase in the intensity, frequency, or duration of exercise.  Direct trauma to the quadriceps muscles or tendons. RISK INCREASES WITH:  Activities that involve forceful contractions of the quadriceps muscles (jumping or sprinting).  Contact sports (soccer or football).  Poor strength and flexibility.  Failure to warm-up properly before activity.  Previous injury to the thigh or knee. PREVENTION  Warm up and stretch properly before activity.  Allow for adequate recovery between workouts.  Maintain physical fitness:  Strength, flexibility, and endurance.  Cardiovascular fitness.  Wear properly fitted and padded protective equipment. PROGNOSIS  If treated properly, then quadriceps muscles strains are usually curable within 6 weeks.  RELATED COMPLICATIONS   Prolonged healing time, if improperly treated or re-injured.  Recurrent symptoms that result in a chronic problem.  Recurrence of symptoms if activity is resumed too soon. TREATMENT  Treatment initially involves the use of ice and medication to help reduce pain and inflammation. The use of strengthening and stretching exercises may help reduce pain with activity. These exercises may be performed at home or with referral to a therapist. Crutches may be recommended to allow the muscle to rest until walking can be completed without limping. Surgery is rarely necessary for this injury, but may be considered if the  injury involves a grade 3 strain, or if symptoms persist for greater than 3 months despite non-surgical (conservative) treatment.  MEDICATION  If pain medication is necessary, then  nonsteroidal anti-inflammatory medications, such as aspirin and ibuprofen, or other minor pain relievers, such as acetaminophen, are often recommended.  Do not take pain medication for 7 days before surgery.  Prescription pain relievers may be given if deemed necessary by your caregiver. Use only as directed and only as much as you need.  Ointments applied to the skin may be helpful.  Corticosteroid injections may be given by your caregiver. These injections should be reserved for the most serious cases, because they may only be given a certain number of times. HEAT AND COLD  Cold treatment (icing) relieves pain and reduces inflammation. Cold treatment should be applied for 10 to 15 minutes every 2 to 3 hours for inflammation and pain and immediately after any activity that aggravates your symptoms. Use ice packs or massage the area with a piece of ice (ice massage).  Heat treatment may be used prior to performing the stretching and strengthening activities prescribed by your caregiver, physical therapist, or athletic trainer. Use a heat pack or soak the injury in warm water. SEEK MEDICAL CARE IF:  Treatment seems to offer no benefit, or the condition worsens.  Any medications produce adverse side effects.

## 2015-09-27 NOTE — Progress Notes (Addendum)
By signing my name below, I, Raven Small, attest that this documentation has been prepared under the direction and in the presence of Arlyss Queen, MD.  Electronically Signed: Thea Alken, ED Scribe. 09/27/2015. 2:20 PM.  Chief Complaint:  Chief Complaint  Patient presents with  . Hip Pain    RIght hip    HPI: [name] is a 82 y.o. female who reports to [facility] today complaining of gradually worsening, right upper leg pain that began 1 month ago. She denies injury or fall. She reports pain with walking and bearing weight. She finds relief to pain when holding onto area. Pt does aerobic and strengthening courses  but denies injury while exercising. She states she has gone to the gym for years but  has not gone this week due to worsening pain.  Past Medical History  Diagnosis Date  . Hypertension   . Diabetes mellitus without complication ([place])   . High cholesterol   . Breast cancer ([place])   . Anemia   . Cataract   . Glaucoma    Past Surgical History  Procedure Laterality Date  . Breast lumpectomy    . Appendectomy    . Eye surgery    . Abdominal hysterectomy     Social History   Social History  . Marital Status: Married    Spouse Name: N/A  . Number of Children: N/A  . Years of Education: N/A   Social History Main Topics  . Smoking status: Never Smoker   . Smokeless tobacco: None  . Alcohol Use: No  . Drug Use: No  . Sexual Activity: No   Other Topics Concern  . None   Social History Narrative   Family History  Problem Relation Age of Onset  . Hyperlipidemia Sister   . Diabetes Brother   . Hyperlipidemia Brother   . Diabetes Maternal Grandmother   . Stroke Maternal Grandfather   . Diabetes Brother    No Known Allergies Prior to Admission medications   Medication Sig Start Date End Date Taking? Authorizing Provider  amLODipine (NORVASC) 5 MG tablet Take 5 mg by mouth daily.   Yes Historical Provider, MD  valsartan (DIOVAN) 160 MG tablet Take 1  tablet (160 mg total) by mouth daily. 01/28/14  Yes Charolette Forward, MD  aspirin EC 81 MG tablet Take 81 mg by mouth at bedtime. Reported on 09/27/2015    Historical Provider, MD  gabapentin (NEURONTIN) 100 MG capsule Take 1 capsule (100 mg total) by mouth at bedtime. Patient not taking: Reported on 09/27/2015 10/25/14   Wendie Agreste, MD     ROS: The patient denies fevers, chills, night sweats, unintentional weight loss, chest pain, palpitations, wheezing, dyspnea on exertion, nausea, vomiting, abdominal pain, dysuria, hematuria, melena, numbness, weakness, or tingling.   All other systems have been reviewed and were otherwise negative with the exception of those mentioned in the HPI and as above.    PHYSICAL EXAM: Filed Vitals:   09/27/15 1348  BP: 160/78  Pulse: 76  Temp: 98 F (36.7 C)  Resp: 16   Body mass index is 27.94 kg/(m^2).   General: Alert, no acute distress HEENT:  Normocephalic, atraumatic, oropharynx patent. Eye: [facility]te M[facility] Cardiovascular:  Regular rate and rhythm, no rubs murmurs or gallops.  No Carotid bruits, radial pulse intact. No pedal edema.  Respiratory: Clear to auscultation bilaterally.  No wheezes, rales, or rhonchi.  No cyanosis, no use of accessory musculature Abdominal: No organomegaly, abdomen is soft  and non-tender, positive bowel sounds.  No masses. Musculoskeletal: Gait intact. No edema. No lower back tenderness.  full ROM of right hip. Questionable defect of mid right quadricept . Pain with flexion of the right hip against resistance.  Skin: No rashes. Neurologic: Facial musculature symmetric. Psychiatric: Patient acts appropriately throughout our interaction. Lymphatic: No cervical or submandibular lymphadenopathy     EKG/XRAY:   Primary read interpreted by Dr. Everlene Farrier at [facility]. Dg Hip Unilat W Or W/o Pelvis 2-3 Views Right  09/27/2015  CLINICAL DATA:  Right hip pain of unknown etiology EXAM: DG HIP (WITH OR WITHOUT PELVIS) 2-3V RIGHT  COMPARISON:  Bilateral hip series of April 18, 2007. FINDINGS: The bony pelvis is adequately mineralized. There is no lytic or blastic lesion nor evidence of a fracture. AP and lateral views of the right hip reveal mild symmetric narrowing of the joint space. The articular surfaces of the right femoral head and the acetabulum remains smoothly rounded. The femoral neck and intertrochanteric and sub trochanteric regions are normal. IMPRESSION: There is mild symmetric narrowing of the right hip joint space consistent with osteoarthritis. There is no acute bony abnormality. Electronically Signed   By: David  Martinique M.D.   On: 09/27/2015 14:44   Dg Femur, Min 2 Views Right  09/27/2015  CLINICAL DATA:  Leg pain of unknown origin. EXAM: RIGHT FEMUR 2 VIEWS COMPARISON:  None. FINDINGS: Hip joint appears normal. No focal finding affecting the femur. No visible joint effusion. Ordinary mild osteoarthritis of the knee. No soft tissue finding in the upper leg. IMPRESSION: No cause of right upper leg pain. The patient does have some osteoarthritis of the knee. Electronically Signed   By: Nelson Chimes M.D.   On: 09/27/2015 14:47    ASSESSMENT/PLAN: I advised her to wear some support shorts. I'm concerned about an injury to the quadriceps muscle. Referral made to orthopedics to get their opinion. In the interim she can take advil  for the discomfort but only for a few days.. X-rays revealed no acute abnormality only mild arthritis.I personally performed the services described in this documentation, which was scribed in my presence. The recorded information has been reviewed and is accurate.   Gross sideeffects, risk and benefits, and alternatives of medications d/w patient. Patient is aware that all medications have potential sideeffects and we are unable to predict every sideeffect or drug-drug interaction that may occur.  Arlyss Queen MD 09/27/2015 2:15 PM

## 2015-09-27 NOTE — Telephone Encounter (Signed)
Call patient and let her know she can take a low dose of Advil for 2-3 days but she cannot stay on that medication. If she has to stay on pain medicine she will need to take extra strength Tylenol.

## 2015-09-29 ENCOUNTER — Telehealth: Payer: Self-pay | Admitting: *Deleted

## 2015-09-29 NOTE — Telephone Encounter (Signed)
Spoke with patient advised 2-3 days of Advil no more.  If pain continues take extra strength tylenol.  Patient understood

## 2015-09-30 DIAGNOSIS — M9903 Segmental and somatic dysfunction of lumbar region: Secondary | ICD-10-CM | POA: Diagnosis not present

## 2015-09-30 DIAGNOSIS — M9905 Segmental and somatic dysfunction of pelvic region: Secondary | ICD-10-CM | POA: Diagnosis not present

## 2015-10-01 DIAGNOSIS — M9903 Segmental and somatic dysfunction of lumbar region: Secondary | ICD-10-CM | POA: Diagnosis not present

## 2015-10-01 DIAGNOSIS — M9905 Segmental and somatic dysfunction of pelvic region: Secondary | ICD-10-CM | POA: Diagnosis not present

## 2015-10-07 DIAGNOSIS — M9905 Segmental and somatic dysfunction of pelvic region: Secondary | ICD-10-CM | POA: Diagnosis not present

## 2015-10-07 DIAGNOSIS — M9903 Segmental and somatic dysfunction of lumbar region: Secondary | ICD-10-CM | POA: Diagnosis not present

## 2015-10-10 DIAGNOSIS — M9903 Segmental and somatic dysfunction of lumbar region: Secondary | ICD-10-CM | POA: Diagnosis not present

## 2015-10-10 DIAGNOSIS — M9905 Segmental and somatic dysfunction of pelvic region: Secondary | ICD-10-CM | POA: Diagnosis not present

## 2015-10-14 DIAGNOSIS — M9903 Segmental and somatic dysfunction of lumbar region: Secondary | ICD-10-CM | POA: Diagnosis not present

## 2015-10-14 DIAGNOSIS — M9905 Segmental and somatic dysfunction of pelvic region: Secondary | ICD-10-CM | POA: Diagnosis not present

## 2015-10-17 DIAGNOSIS — M9903 Segmental and somatic dysfunction of lumbar region: Secondary | ICD-10-CM | POA: Diagnosis not present

## 2015-10-17 DIAGNOSIS — M9905 Segmental and somatic dysfunction of pelvic region: Secondary | ICD-10-CM | POA: Diagnosis not present

## 2015-10-21 DIAGNOSIS — M9905 Segmental and somatic dysfunction of pelvic region: Secondary | ICD-10-CM | POA: Diagnosis not present

## 2015-10-21 DIAGNOSIS — M9903 Segmental and somatic dysfunction of lumbar region: Secondary | ICD-10-CM | POA: Diagnosis not present

## 2015-10-24 DIAGNOSIS — M9905 Segmental and somatic dysfunction of pelvic region: Secondary | ICD-10-CM | POA: Diagnosis not present

## 2015-10-24 DIAGNOSIS — M9903 Segmental and somatic dysfunction of lumbar region: Secondary | ICD-10-CM | POA: Diagnosis not present

## 2015-10-28 DIAGNOSIS — M9903 Segmental and somatic dysfunction of lumbar region: Secondary | ICD-10-CM | POA: Diagnosis not present

## 2015-10-28 DIAGNOSIS — M9905 Segmental and somatic dysfunction of pelvic region: Secondary | ICD-10-CM | POA: Diagnosis not present

## 2015-11-11 DIAGNOSIS — R252 Cramp and spasm: Secondary | ICD-10-CM | POA: Diagnosis not present

## 2016-01-13 DIAGNOSIS — H401222 Low-tension glaucoma, left eye, moderate stage: Secondary | ICD-10-CM | POA: Diagnosis not present

## 2016-01-13 DIAGNOSIS — H40023 Open angle with borderline findings, high risk, bilateral: Secondary | ICD-10-CM | POA: Diagnosis not present

## 2016-01-13 DIAGNOSIS — H40052 Ocular hypertension, left eye: Secondary | ICD-10-CM | POA: Diagnosis not present

## 2016-01-13 DIAGNOSIS — H35031 Hypertensive retinopathy, right eye: Secondary | ICD-10-CM | POA: Diagnosis not present

## 2016-02-27 DIAGNOSIS — R509 Fever, unspecified: Secondary | ICD-10-CM | POA: Diagnosis not present

## 2016-02-27 DIAGNOSIS — I10 Essential (primary) hypertension: Secondary | ICD-10-CM | POA: Diagnosis not present

## 2016-02-27 DIAGNOSIS — R739 Hyperglycemia, unspecified: Secondary | ICD-10-CM | POA: Diagnosis not present

## 2016-02-27 DIAGNOSIS — M549 Dorsalgia, unspecified: Secondary | ICD-10-CM | POA: Diagnosis not present

## 2016-02-27 DIAGNOSIS — E559 Vitamin D deficiency, unspecified: Secondary | ICD-10-CM | POA: Diagnosis not present

## 2016-03-30 IMAGING — CR DG FEMUR 2+V*R*
4 series · 4 of 4 positions shown · non-contrast
Comparison: None.

CLINICAL DATA: Leg pain of unknown origin.

EXAM:
RIGHT FEMUR 2 VIEWS

[AP (1 of 2)]
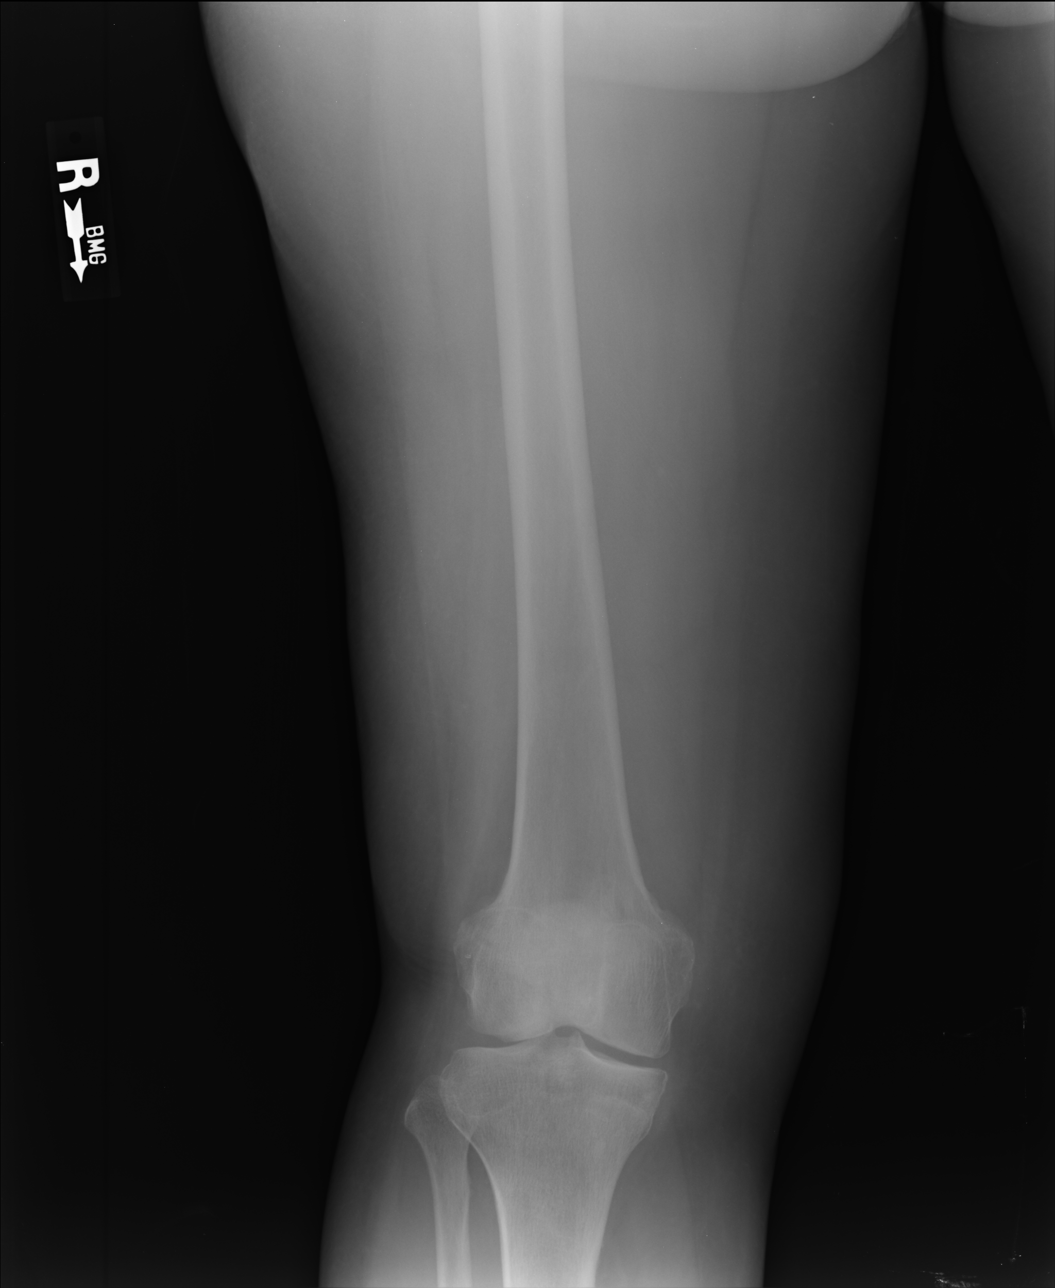

[AP (2 of 2)]
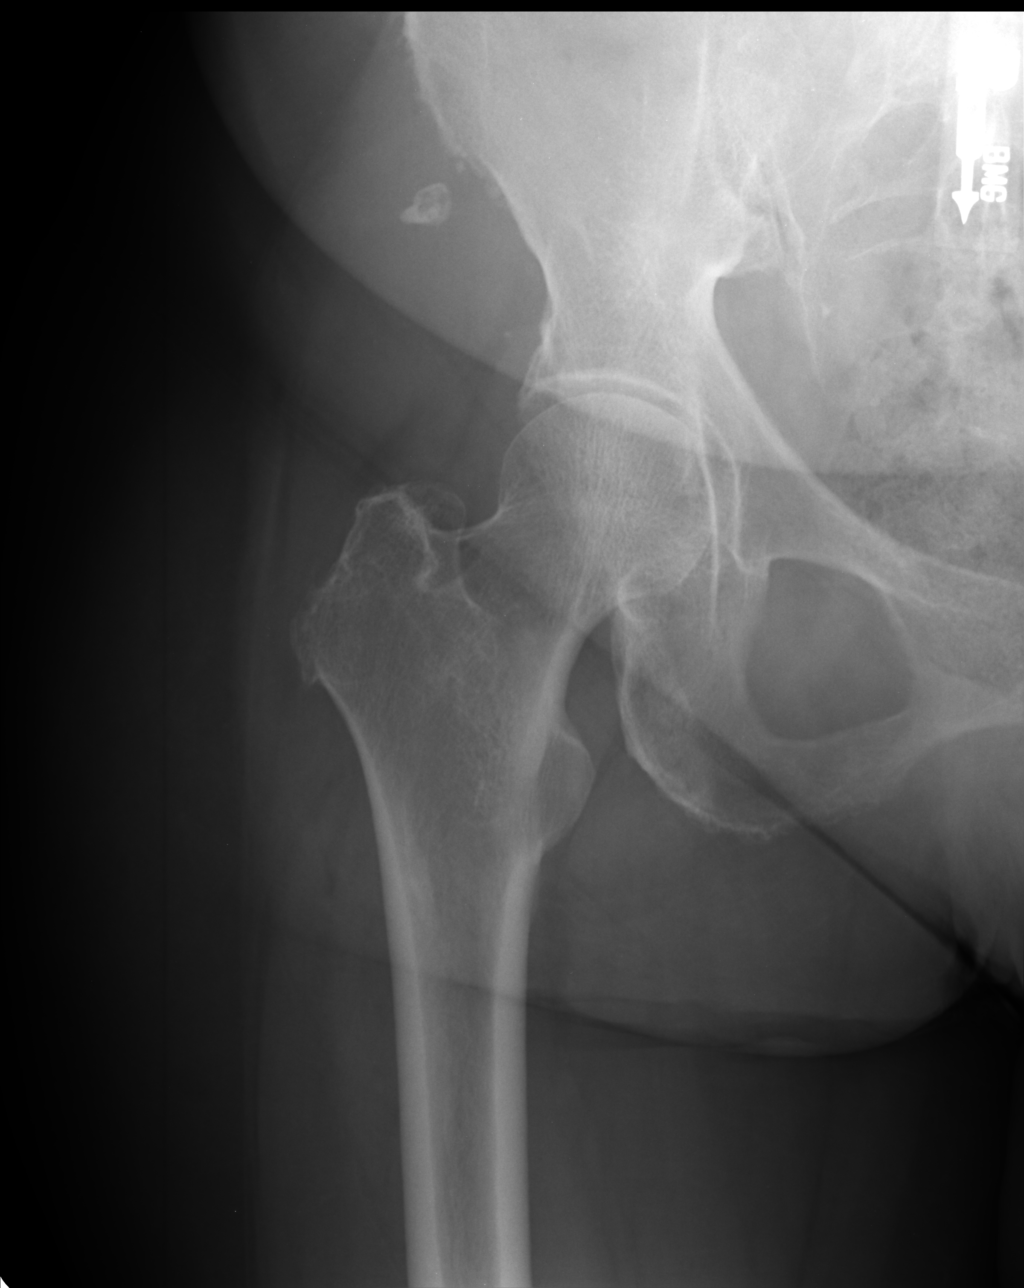

[lateral (1 of 2)]
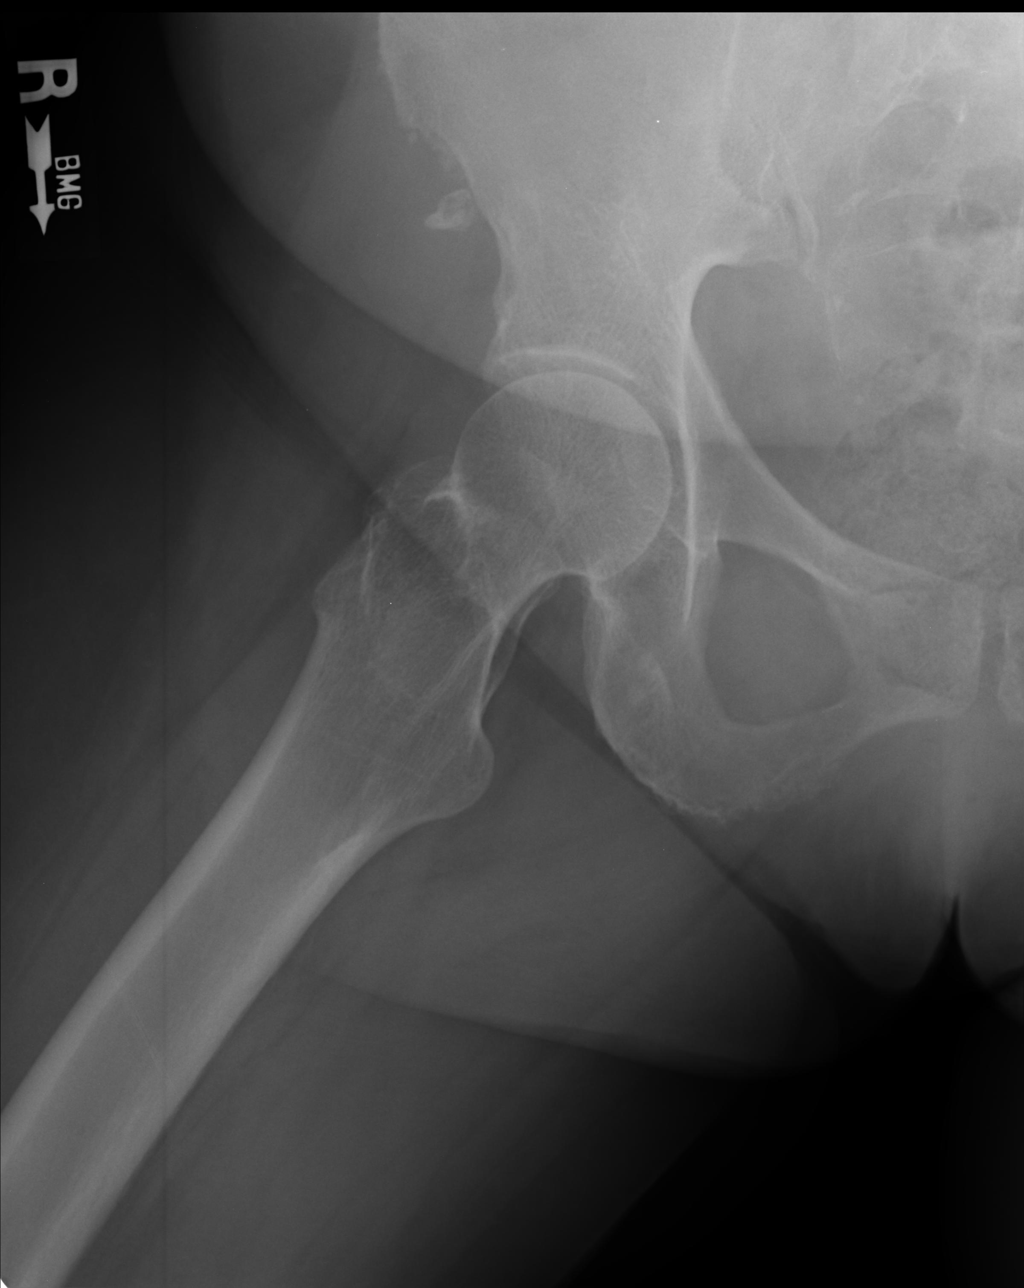

[lateral (2 of 2)]
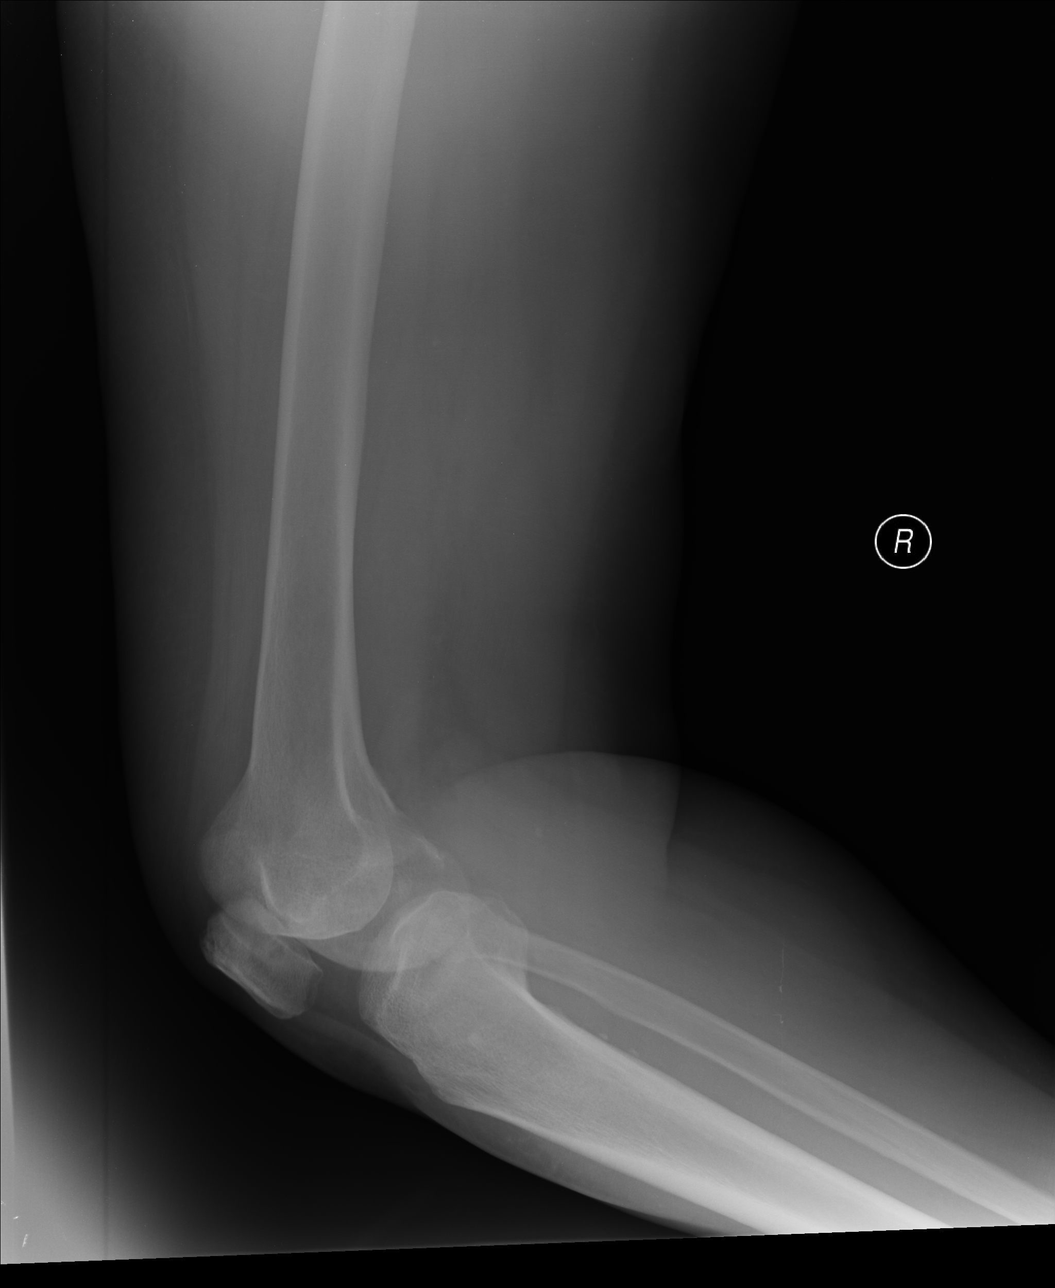

[4 of 4 positions shown; findings below may reference images not displayed]

FINDINGS: Hip joint appears normal. No focal finding affecting the femur. No
visible joint effusion. Ordinary mild osteoarthritis of the knee. No
soft tissue finding in the upper leg.
IMPRESSION: No cause of right upper leg pain. The patient does have some
osteoarthritis of the knee.

## 2016-03-30 IMAGING — CR DG HIP (WITH OR WITHOUT PELVIS) 2-3V*R*
3 series · 3 of 3 positions shown · non-contrast
Comparison: Bilateral hip series of April 18, 2007.

CLINICAL DATA: Right hip pain of unknown etiology

EXAM:
DG HIP (WITH OR WITHOUT PELVIS) 2-3V RIGHT

[AP (1 of 2)]
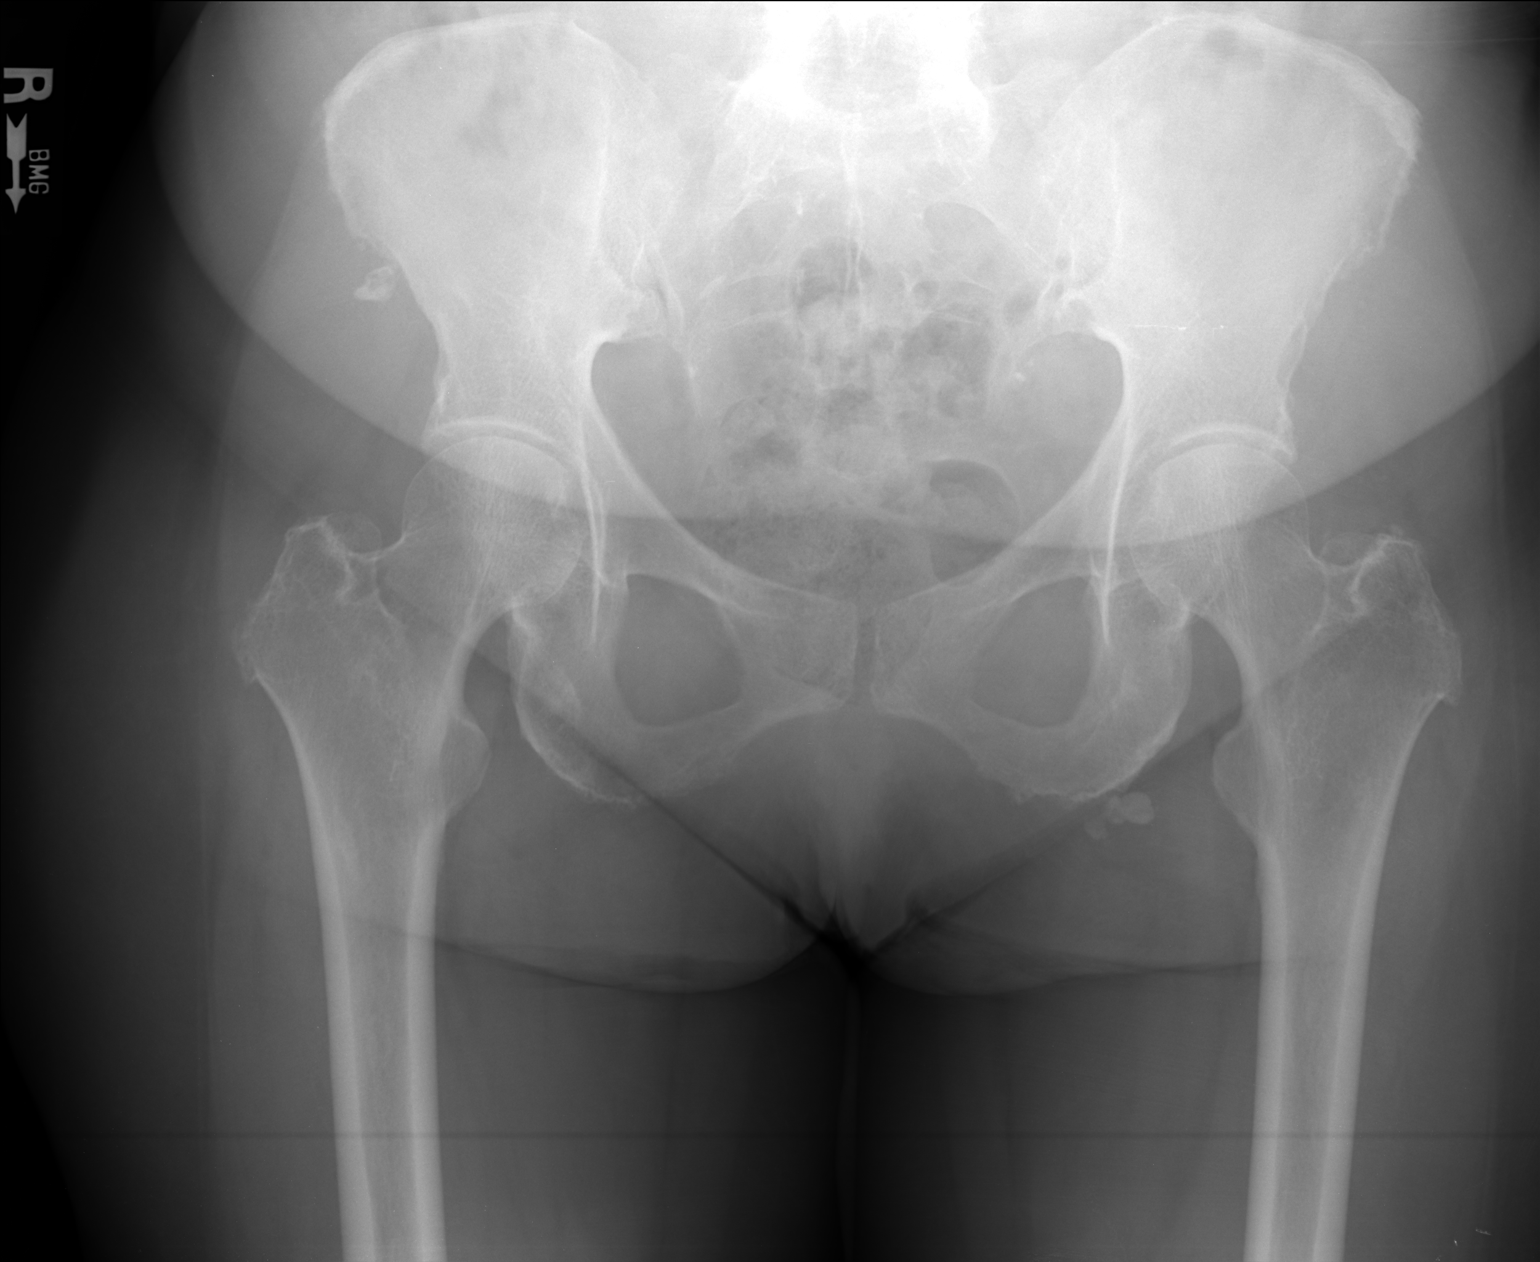

[AP (2 of 2)]
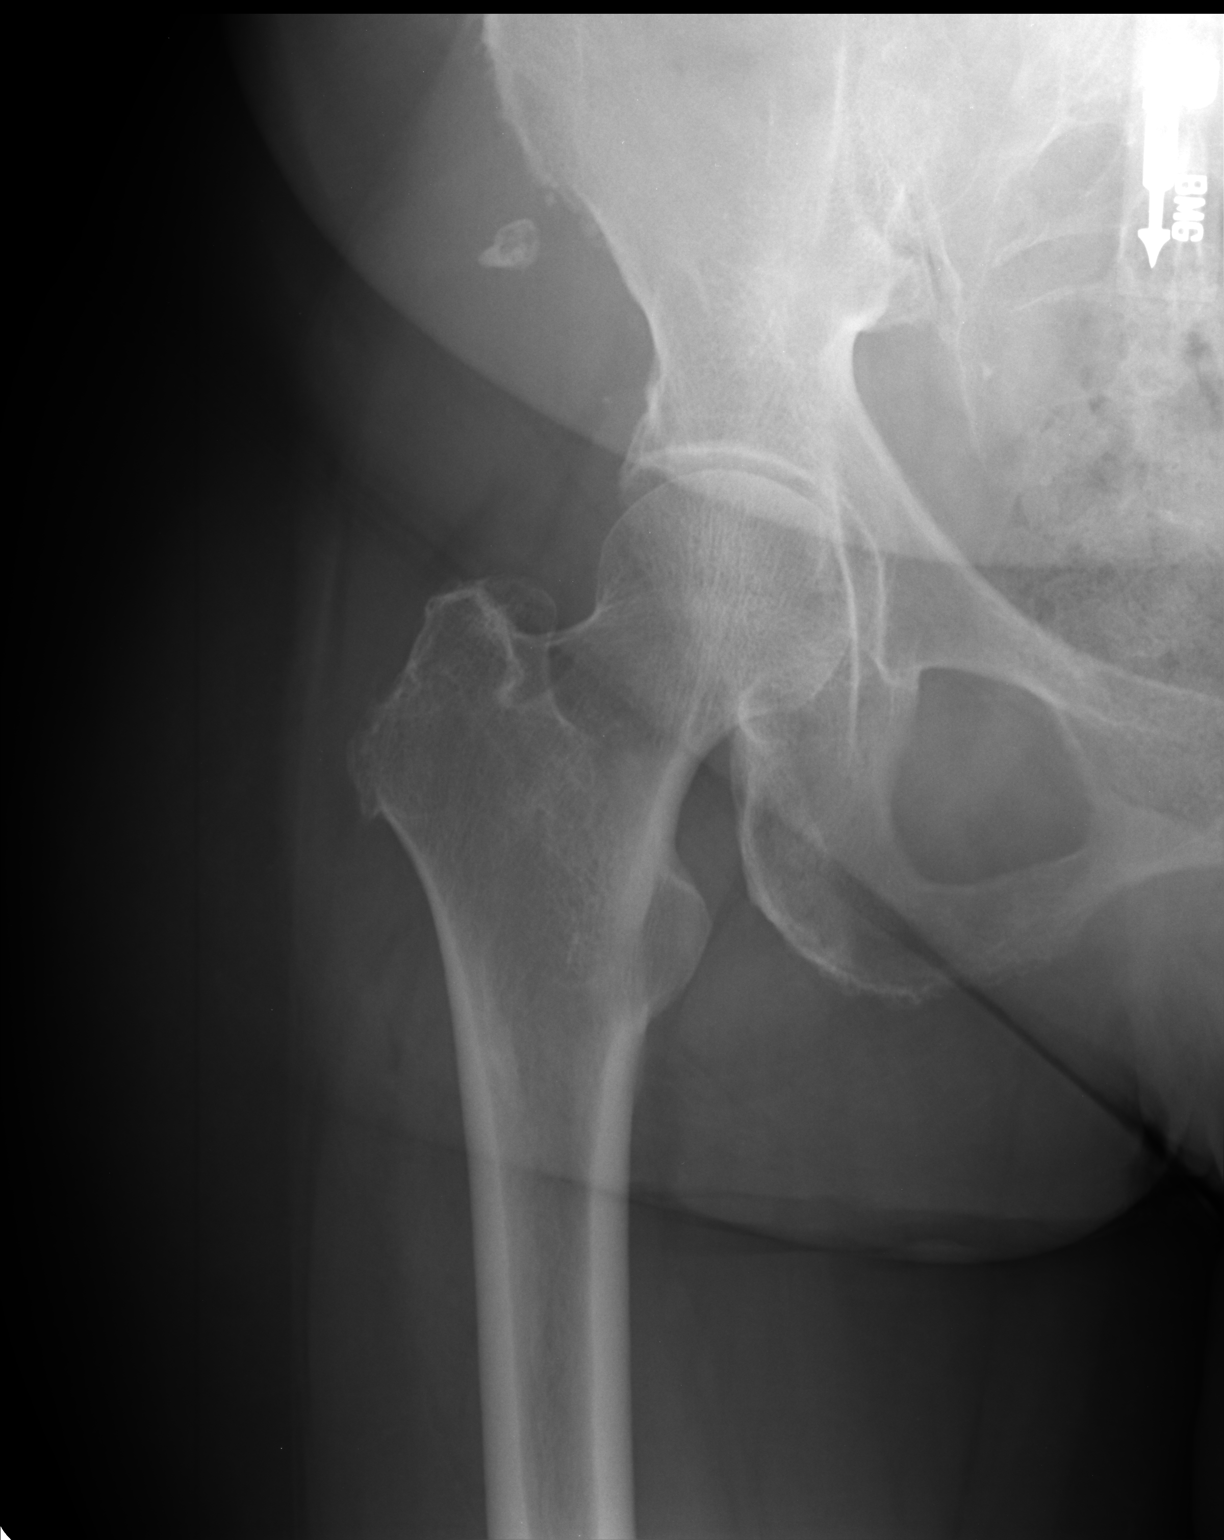

[lateral]
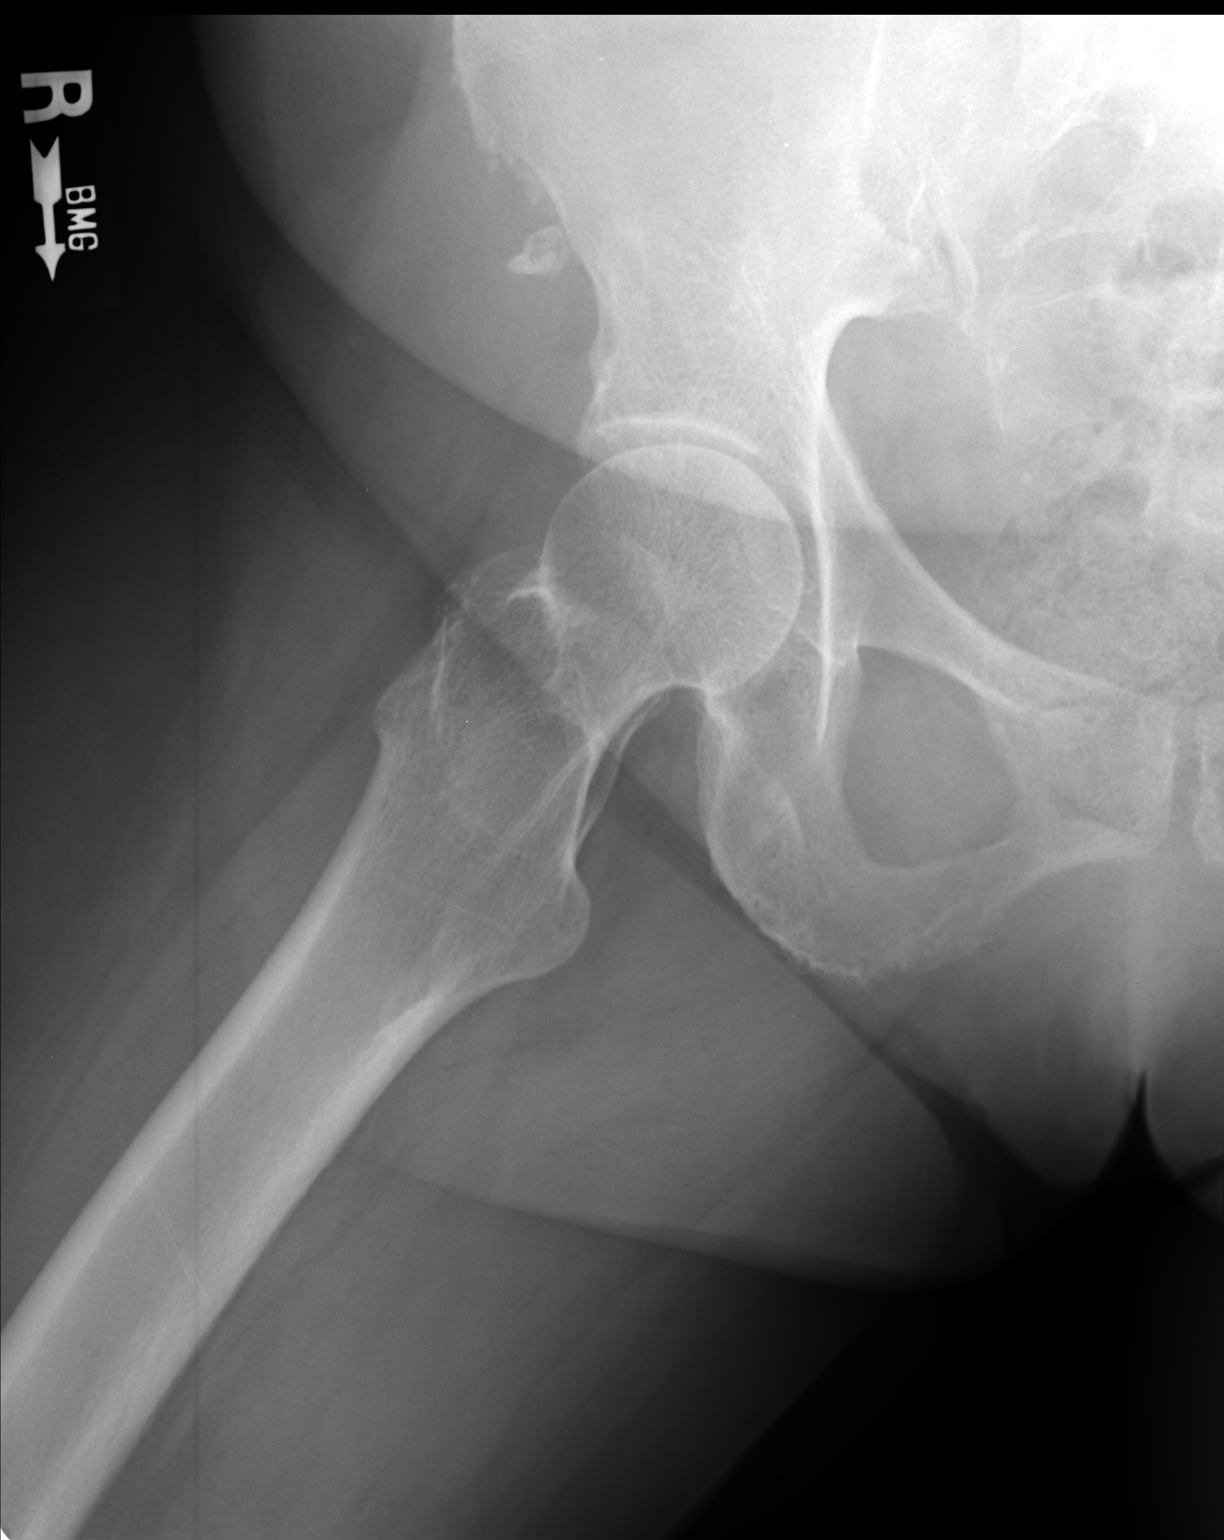

[3 of 3 positions shown; findings below may reference images not displayed]

FINDINGS: The bony pelvis is adequately mineralized. There is no lytic or
blastic lesion nor evidence of a fracture. AP and lateral views of
the right hip reveal mild symmetric narrowing of the joint space.
The articular surfaces of the right femoral head and the acetabulum
remains smoothly rounded. The femoral neck and intertrochanteric and
sub trochanteric regions are normal.
IMPRESSION: There is mild symmetric narrowing of the right hip joint space
consistent with osteoarthritis. There is no acute bony abnormality.

## 2016-04-10 ENCOUNTER — Other Ambulatory Visit: Payer: Self-pay | Admitting: Cardiology

## 2016-04-10 DIAGNOSIS — Z1231 Encounter for screening mammogram for malignant neoplasm of breast: Secondary | ICD-10-CM

## 2016-04-15 DIAGNOSIS — H40023 Open angle with borderline findings, high risk, bilateral: Secondary | ICD-10-CM | POA: Diagnosis not present

## 2016-04-15 DIAGNOSIS — H40052 Ocular hypertension, left eye: Secondary | ICD-10-CM | POA: Diagnosis not present

## 2016-04-15 DIAGNOSIS — H401222 Low-tension glaucoma, left eye, moderate stage: Secondary | ICD-10-CM | POA: Diagnosis not present

## 2016-04-17 DIAGNOSIS — Z0000 Encounter for general adult medical examination without abnormal findings: Secondary | ICD-10-CM | POA: Diagnosis not present

## 2016-04-23 ENCOUNTER — Ambulatory Visit: Payer: Medicare HMO

## 2016-05-14 ENCOUNTER — Ambulatory Visit
Admission: RE | Admit: 2016-05-14 | Discharge: 2016-05-14 | Disposition: A | Discharge status: Home / Self Care | Payer: PPO | Source: Ambulatory Visit | Attending: Cardiology | Admitting: Cardiology

## 2016-05-14 DIAGNOSIS — Z1231 Encounter for screening mammogram for malignant neoplasm of breast: Secondary | ICD-10-CM

## 2016-06-03 DIAGNOSIS — Z86010 Personal history of colonic polyps: Secondary | ICD-10-CM | POA: Diagnosis not present

## 2016-10-01 ENCOUNTER — Ambulatory Visit (INDEPENDENT_AMBULATORY_CARE_PROVIDER_SITE_OTHER): Payer: PPO | Admitting: Physician Assistant

## 2016-10-01 VITALS — BP 160/80 | HR 84 | Temp 98.4°F | Resp 18 | Ht 61.0 in | Wt 150.8 lb

## 2016-10-01 DIAGNOSIS — R109 Unspecified abdominal pain: Secondary | ICD-10-CM | POA: Diagnosis not present

## 2016-10-01 DIAGNOSIS — M5432 Sciatica, left side: Secondary | ICD-10-CM | POA: Diagnosis not present

## 2016-10-01 LAB — POCT URINALYSIS DIP (MANUAL ENTRY)
BILIRUBIN UA: NEGATIVE
BILIRUBIN UA: NEGATIVE
GLUCOSE UA: NEGATIVE
Leukocytes, UA: NEGATIVE
NITRITE UA: NEGATIVE
Protein Ur, POC: 30 — AB
SPEC GRAV UA: 1.025 (ref 1.030–1.035)
Urobilinogen, UA: 4 — AB (ref ?–2.0)
pH, UA: 6.5 (ref 5.0–8.0)

## 2016-10-01 MED ORDER — KETOROLAC TROMETHAMINE 30 MG/ML IJ SOLN
30.0000 mg | Freq: Once | INTRAMUSCULAR | Status: AC
  Administered 2016-10-01: 30 mg via INTRAMUSCULAR

## 2016-10-01 MED ORDER — TRAMADOL HCL 50 MG PO TABS
50.0000 mg | ORAL_TABLET | Freq: Three times a day (TID) | ORAL | 0 refills | Status: DC | PRN
Start: 2016-10-01 — End: 2018-08-25

## 2016-10-01 MED ORDER — PREDNISONE 20 MG PO TABS
ORAL_TABLET | ORAL | 0 refills | Status: DC
Start: 2016-10-01 — End: 2018-08-25

## 2016-10-01 NOTE — Patient Instructions (Addendum)
Return to clinic if your symptoms do not improve within 48 hours.  This may last for about 10 days, but it should be improving.  Make sure that you are hydrating well.  You must also make sure that you are taking your blood pressure medication as prescribed.   You can take 1 tablet of the prednisone today, but make sure you start the bp medication today.   Sciatica Sciatica is pain, numbness, weakness, or tingling along your sciatic nerve. The sciatic nerve starts in the lower back and goes down the back of each leg. Sciatica happens when this nerve is pinched or has pressure put on it. Sciatica usually goes away on its own or with treatment. Sometimes, sciatica may keep coming back (recur). Follow these instructions at home: Medicines   Take over-the-counter and prescription medicines only as told by your doctor.  Do not drive or use heavy machinery while taking prescription pain medicine. Managing pain   If directed, put ice on the affected area.  Put ice in a plastic bag.  Place a towel between your skin and the bag.  Leave the ice on for 20 minutes, 2-3 times a day.  After icing, apply heat to the affected area before you exercise or as often as told by your doctor. Use the heat source that your doctor tells you to use, such as a moist heat pack or a heating pad.  Place a towel between your skin and the heat source.  Leave the heat on for 20-30 minutes.  Remove the heat if your skin turns bright red. This is especially important if you are unable to feel pain, heat, or cold. You may have a greater risk of getting burned. Activity   Return to your normal activities as told by your doctor. Ask your doctor what activities are safe for you.  Avoid activities that make your sciatica worse.  Take short rests during the day. Rest in a lying or standing position. This is usually better than sitting to rest.  When you rest for a long time, do some physical activity or stretching  between periods of rest.  Avoid sitting for a long time without moving. Get up and move around at least one time each hour.  Exercise and stretch regularly, as told by your doctor.  Do not lift anything that is heavier than 10 lb (4.5 kg) while you have symptoms of sciatica.  Avoid lifting heavy things even when you do not have symptoms.  Avoid lifting heavy things over and over.  When you lift objects, always lift in a way that is safe for your body. To do this, you should:  Bend your knees.  Keep the object close to your body.  Avoid twisting. General instructions   Use good posture.  Avoid leaning forward when you are sitting.  Avoid hunching over when you are standing.  Stay at a healthy weight.  Wear comfortable shoes that support your feet. Avoid wearing high heels.  Avoid sleeping on a mattress that is too soft or too hard. You might have less pain if you sleep on a mattress that is firm enough to support your back.  Keep all follow-up visits as told by your doctor. This is important. Contact a doctor if:  You have pain that:  Wakes you up when you are sleeping.  Gets worse when you lie down.  Is worse than the pain you have had in the past.  Lasts longer than 4 weeks.  You lose weight for without trying. Get help right away if:  You cannot control when you pee (urinate) or poop (have a bowel movement).  You have weakness in any of these areas and it gets worse.  Lower back.  Lower belly (pelvis).  Butt (buttocks).  Legs.  You have redness or swelling of your back.  You have a burning feeling when you pee. This information is not intended to replace advice given to you by your health care provider. Make sure you discuss any questions you have with your health care provider. Document Released: 03/31/2008 Document Revised: 11/28/2015 Document Reviewed: 03/01/2015 Elsevier Interactive Patient Education  2017 [employer].     IF you received  an x-ray today, you will receive an invoice from [facility]. Please contact [facility] at [phone] with questions or concerns regarding your invoice.   IF you received labwork today, you will receive an invoice from [facility]. Please contact [facility] at [phone] with questions or concerns regarding your invoice.   Our billing staff will not be able to assist you with questions regarding bills from these companies.  You will be contacted with the lab results as soon as they are available. The fastest way to get your results is to activate your My Chart account. Instructions are located on the last page of this paperwork. If you have not heard from [place] regarding the results in 2 weeks, please contact this office.

## 2016-10-01 NOTE — Progress Notes (Signed)
PRIMARY CARE AT [place], [place] [address] [phone]  Date:  10/01/2016   Name:  [name]   DOB:  [birth year]   MRN:  [id number]  PCP:  Sarah Nettle, MD    History of Present Illness:  [name] is a 83 y.o. female patient who presents to PCP with  Chief Complaint  Patient presents with  . Back Pain    left side of lower back to leg x 1 day    Woke up this morning, with left sided pain, from back that radiates to the buttock.  Painful at the left hip.  The pain has been constant.  She is able to ambulate.  No numbness tingling, or weakness.  No fever.  She has a hx of sciatica.  Today bp is elevated.  She forgot to take her bp medication.   She is also concerned that her primary care provider is retiring.   Patient Active Problem List   Diagnosis Date Noted  . Syncope 01/27/2014    Past Medical History:  Diagnosis Date  . Anemia   . Breast cancer ([place])   . Cataract   . Diabetes mellitus without complication ([place])   . Glaucoma   . High cholesterol   . Hypertension     Past Surgical History:  Procedure Laterality Date  . ABDOMINAL HYSTERECTOMY    . APPENDECTOMY    . BREAST LUMPECTOMY    . EYE SURGERY      Social History  Substance Use Topics  . Smoking status: Never Smoker  . Smokeless tobacco: Never Used  . Alcohol use No    Family History  Problem Relation Age of Onset  . Hyperlipidemia Sister   . Diabetes Brother   . Hyperlipidemia Brother   . Diabetes Maternal Grandmother   . Stroke Maternal Grandfather   . Diabetes Brother     No Known Allergies  Medication list has been reviewed and updated.  Current Outpatient Prescriptions on File Prior to Visit  Medication Sig Dispense Refill  . amLODipine (NORVASC) 5 MG tablet Take 5 mg by mouth daily.    Marland Kitchen aspirin EC 81 MG tablet Take 81 mg by mouth at bedtime. Reported on 09/27/2015    . valsartan (DIOVAN) 160 MG tablet Take 1 tablet (160 mg total) by mouth daily.  30 tablet 3  . gabapentin (NEURONTIN) 100 MG capsule Take 1 capsule (100 mg total) by mouth at bedtime. (Patient not taking: Reported on 09/27/2015) 30 capsule 0   No current facility-administered medications on file prior to visit.     ROS ROS otherwise unremarkable unless listed above.  Physical Examination: BP (!) 164/80   Pulse 84   Temp 98.4 F (36.9 C) (Oral)   Resp 18   Ht 5\' 1"  (1.549 m)   Wt 150 lb 12.8 oz (68.4 kg)   SpO2 98%   BMI 28.49 kg/m  Ideal Body Weight: Weight in (lb) to have BMI = 25: 132  Physical Exam  Constitutional: She is oriented to person, place, and time. She appears well-developed and well-nourished. No distress.  HENT:  Head: Normocephalic and atraumatic.  Right Ear: External ear normal.  Left Ear: External ear normal.  Eyes: Conjunctivae and EOM are normal. Pupils are equal, round, and reactive to light.  Cardiovascular: Normal rate.   Pulmonary/Chest: Effort normal. No respiratory distress.  Musculoskeletal:       Lumbar back: She exhibits tenderness (tender along the lumbar adjacent musculature and  at the buttock.  no spasm detected.).  Left straight leg raise test.  Neurological: She is alert and oriented to person, place, and time.  Skin: She is not diaphoretic.  Psychiatric: She has a normal mood and affect. Her behavior is normal.   Assessment and Plan: [name] is a 83 y.o. female who is here today for low back pain. We will place her on a prednisone taper.   She was advised and warned to immediately take her amlodipine and losartan immediately at home.  She can take 1 tablet today.  Given 1 toradol.   Advised icing the area of the back and buttock. Return if her symptoms are not improving.   Side pain - Plan: POCT urinalysis dipstick, predniSONE (DELTASONE) 20 MG tablet  Sciatica of left side - Plan: predniSONE (DELTASONE) 20 MG tablet, traMADol (ULTRAM) 50 MG tablet, ketorolac (TORADOL) 30 MG/ML injection 30 mg  Ivar Drape, PA-C [facility] 3/29/20186:56 PM

## 2016-10-21 ENCOUNTER — Ambulatory Visit (INDEPENDENT_AMBULATORY_CARE_PROVIDER_SITE_OTHER): Payer: PPO | Admitting: Emergency Medicine

## 2016-10-21 VITALS — BP 134/67 | HR 98 | Temp 98.2°F | Resp 16 | Ht 61.0 in | Wt 149.4 lb

## 2016-10-21 DIAGNOSIS — M25559 Pain in unspecified hip: Secondary | ICD-10-CM | POA: Diagnosis not present

## 2016-10-21 DIAGNOSIS — G8929 Other chronic pain: Secondary | ICD-10-CM | POA: Insufficient documentation

## 2016-10-21 DIAGNOSIS — R1030 Lower abdominal pain, unspecified: Secondary | ICD-10-CM | POA: Insufficient documentation

## 2016-10-21 LAB — POCT CBC
Granulocyte percent: 68.2 %G (ref 37–80)
HCT, POC: 34.9 % — AB (ref 37.7–47.9)
HEMOGLOBIN: 12.2 g/dL (ref 12.2–16.2)
LYMPH, POC: 2.1 (ref 0.6–3.4)
MCH, POC: 31.9 pg — AB (ref 27–31.2)
MCHC: 35.1 g/dL (ref 31.8–35.4)
MCV: 90.9 fL (ref 80–97)
MID (cbc): 0.5 (ref 0–0.9)
MPV: 7.1 fL (ref 0–99.8)
PLATELET COUNT, POC: 232 10*3/uL (ref 142–424)
POC Granulocyte: 5.6 (ref 2–6.9)
POC LYMPH PERCENT: 26.1 %L (ref 10–50)
POC MID %: 5.7 % (ref 0–12)
RBC: 3.84 M/uL — AB (ref 4.04–5.48)
RDW, POC: 15.9 %
WBC: 8.2 10*3/uL (ref 4.6–10.2)

## 2016-10-21 MED ORDER — AMOXICILLIN-POT CLAVULANATE 875-125 MG PO TABS: 1.0000 | ORAL_TABLET | Freq: Two times a day (BID) | ORAL | 0 refills | Status: AC

## 2016-10-21 NOTE — Patient Instructions (Addendum)
     IF you received an x-ray today, you will receive an invoice from [facility]. Please contact [facility] at [phone] with questions or concerns regarding your invoice.   IF you received labwork today, you will receive an invoice from [facility]. Please contact [facility] at [phone] with questions or concerns regarding your invoice.   Our billing staff will not be able to assist you with questions regarding bills from these companies.  You will be contacted with the lab results as soon as they are available. The fastest way to get your results is to activate your My Chart account. Instructions are located on the last page of this paperwork. If you have not heard from us regarding the results in 2 weeks, please contact this office.     Diverticulitis Diverticulitis is when small pockets in your large intestine (colon) get infected or swollen. This causes stomach pain and watery poop (diarrhea). These pouches are called diverticula. They form in people who have a condition called diverticulosis. Follow these instructions at home: Medicines  Take over-the-counter and prescription medicines only as told by your doctor. These include: ? Antibiotics. ? Pain medicines. ? Fiber pills. ? Probiotics. ? Stool softeners.  Do not drive or use heavy machinery while taking prescription pain medicine.  If you were prescribed an antibiotic, take it as told. Do not stop taking it even if you feel better. General instructions  Follow a diet as told by your doctor.  When you feel better, your doctor may tell you to change your diet. You may need to eat a lot of fiber. Fiber makes it easier to poop (have bowel movements). Healthy foods with fiber include: ? Berries. ? Beans. ? Lentils. ? Green vegetables.  Exercise 3 or more times a week. Aim for 30 minutes each time. Exercise enough to sweat and make your heart beat faster.  Keep all follow-up visits as told. This is  important. You may need to have an exam of the large intestine. This is called a colonoscopy. Contact a doctor if:  Your pain does not get better.  You have a hard time eating or drinking.  You are not pooping like normal. Get help right away if:  Your pain gets worse.  Your problems do not get better.  Your problems get worse very fast.  You have a fever.  You throw up (vomit) more than one time.  You have poop that is: ? Bloody. ? Black. ? Tarry. Summary  Diverticulitis is when small pockets in your large intestine (colon) get infected or swollen.  Take medicines only as told by your doctor.  Follow a diet as told by your doctor. This information is not intended to replace advice given to you by your health care provider. Make sure you discuss any questions you have with your health care provider. Document Released: 12/09/2007 Document Revised: 07/09/2016 Document Reviewed: 07/09/2016 Elsevier Interactive Patient Education  2017 [employer]  

## 2016-10-21 NOTE — Progress Notes (Signed)
[name] 83 y.o.   Chief Complaint  Patient presents with  . Abdominal Pain    Lower ABD pain x 4 weeks   . Hip Pain    Pelvic/Hip pain     HISTORY OF PRESENT ILLNESS: This is a 83 y.o. female complaining of lower abdominal pain on and off x 2 months; has h/o diverticulosis and eats a lot of nuts; denies fever, chills, n/v, urinary symptoms; eating and drinking ok but feels symptoms get worse with eating. Also c/o chronic bilateral hip pain x years.  HPI   Prior to Admission medications   Medication Sig Start Date End Date Taking? Authorizing Provider  amLODipine (NORVASC) 5 MG tablet Take 5 mg by mouth daily.   Yes Historical Provider, MD  valsartan (DIOVAN) 160 MG tablet Take 1 tablet (160 mg total) by mouth daily. 01/28/14  Yes Charolette Forward, MD  aspirin EC 81 MG tablet Take 81 mg by mouth at bedtime. Reported on 09/27/2015    Historical Provider, MD  gabapentin (NEURONTIN) 100 MG capsule Take 1 capsule (100 mg total) by mouth at bedtime. Patient not taking: Reported on 09/27/2015 10/25/14   Wendie Agreste, MD  predniSONE (DELTASONE) 20 MG tablet Take1 PO x1day, 3 PO QAM x3days, 2 PO QAM x3days, 1 PO QAM x3days Patient not taking: Reported on 10/21/2016 10/01/16   Dorian Heckle English, PA  traMADol (ULTRAM) 50 MG tablet Take 1 tablet (50 mg total) by mouth every 8 (eight) hours as needed. Patient not taking: Reported on 10/21/2016 10/01/16   Dorian Heckle English, PA    No Known Allergies  Patient Active Problem List   Diagnosis Date Noted  . Syncope 01/27/2014    Past Medical History:  Diagnosis Date  . Anemia   . Breast cancer ([place])   . Cataract   . Diabetes mellitus without complication ([place])   . Glaucoma   . High cholesterol   . Hypertension     Past Surgical History:  Procedure Laterality Date  . ABDOMINAL HYSTERECTOMY    . APPENDECTOMY    . BREAST LUMPECTOMY    . EYE SURGERY      Social History   Social History  . Marital status: Married    Spouse  name: N/A  . Number of children: N/A  . Years of education: N/A   Occupational History  . Not on file.   Social History Main Topics  . Smoking status: Never Smoker  . Smokeless tobacco: Never Used  . Alcohol use No  . Drug use: No  . Sexual activity: No   Other Topics Concern  . Not on file   Social History Narrative  . No narrative on file    Family History  Problem Relation Age of Onset  . Hyperlipidemia Sister   . Diabetes Brother   . Hyperlipidemia Brother   . Diabetes Maternal Grandmother   . Stroke Maternal Grandfather   . Diabetes Brother      Review of Systems  Constitutional: Negative for chills, fever and weight loss.  HENT: Negative.   Eyes: Negative.   Respiratory: Negative.  Negative for cough, hemoptysis and shortness of breath.   Cardiovascular: Negative.  Negative for chest pain, palpitations and leg swelling.  Gastrointestinal: Positive for abdominal pain. Negative for blood in stool, diarrhea, melena, nausea and vomiting.  Genitourinary: Negative.  Negative for dysuria, frequency, hematuria and urgency.  Musculoskeletal: Positive for joint pain (chronic hip bilateral).  Skin: Negative.  Negative for rash.  Neurological: Negative.  Negative for dizziness, sensory change, focal weakness and headaches.  Endo/Heme/Allergies: Negative.   All other systems reviewed and are negative.  Vitals:   10/21/16 1548  BP: 134/67  Pulse: 98  Resp: 16  Temp: 98.2 F (36.8 C)     Physical Exam  Constitutional: She is oriented to person, place, and time. She appears well-developed and well-nourished.  HENT:  Head: Normocephalic and atraumatic.  Nose: Nose normal.  Mouth/Throat: Oropharynx is clear and moist. No oropharyngeal exudate.  Eyes: Conjunctivae and EOM are normal. Pupils are equal, round, and reactive to light.  Neck: Normal range of motion. Neck supple. No JVD present. No thyromegaly present.  Cardiovascular: Normal rate, regular rhythm, normal  heart sounds and intact distal pulses.   Pulmonary/Chest: Effort normal and breath sounds normal.  Abdominal: Soft. Bowel sounds are normal. She exhibits no distension. There is no tenderness.  Musculoskeletal: Normal range of motion.  Hips: FROM, non-tender.  Lymphadenopathy:    She has no cervical adenopathy.  Neurological: She is alert and oriented to person, place, and time. No sensory deficit. She exhibits normal muscle tone.  Skin: Skin is warm and dry. Capillary refill takes less than 2 seconds. No rash noted.  Psychiatric: She has a normal mood and affect. Her behavior is normal.  Vitals reviewed.    ASSESSMENT & PLAN: [name] was seen today for abdominal pain and hip pain.  Diagnoses and all orders for this visit:  Lower abdominal pain Comments: suspected mild diverticulitis Orders: -     POCT CBC -     US Abdomen Complete; Future  Chronic hip pain, unspecified laterality  Other orders -     amoxicillin-clavulanate (AUGMENTIN) 875-125 MG tablet; Take 1 tablet by mouth 2 (two) times daily.    Patient Instructions       IF you received an x-ray today, you will receive an invoice from [facility]. Please contact [facility] at [phone] with questions or concerns regarding your invoice.   IF you received labwork today, you will receive an invoice from [facility]. Please contact [facility] at [phone] with questions or concerns regarding your invoice.   Our billing staff will not be able to assist you with questions regarding bills from these companies.  You will be contacted with the lab results as soon as they are available. The fastest way to get your results is to activate your My Chart account. Instructions are located on the last page of this paperwork. If you have not heard from [place] regarding the results in 2 weeks, please contact this office.      Diverticulitis Diverticulitis is when small pockets in your large intestine (colon)  get infected or swollen. This causes stomach pain and watery poop (diarrhea). These pouches are called diverticula. They form in people who have a condition called diverticulosis. Follow these instructions at home: Medicines   Take over-the-counter and prescription medicines only as told by your doctor. These include:  Antibiotics.  Pain medicines.  Fiber pills.  Probiotics.  Stool softeners.  Do not drive or use heavy machinery while taking prescription pain medicine.  If you were prescribed an antibiotic, take it as told. Do not stop taking it even if you feel better. General instructions   Follow a diet as told by your doctor.  When you feel better, your doctor may tell you to change your diet. You may need to eat a lot of fiber. Fiber makes it easier to poop (have bowel movements). Healthy  foods with fiber include:  Berries.  Beans.  Lentils.  Green vegetables.  Exercise 3 or more times a week. Aim for 30 minutes each time. Exercise enough to sweat and make your heart beat faster.  Keep all follow-up visits as told. This is important. You may need to have an exam of the large intestine. This is called a colonoscopy. Contact a doctor if:  Your pain does not get better.  You have a hard time eating or drinking.  You are not pooping like normal. Get help right away if:  Your pain gets worse.  Your problems do not get better.  Your problems get worse very fast.  You have a fever.  You throw up (vomit) more than one time.  You have poop that is:  Bloody.  Black.  [place]. Summary  Diverticulitis is when small pockets in your large intestine (colon) get infected or swollen.  Take medicines only as told by your doctor.  Follow a diet as told by your doctor. This information is not intended to replace advice given to you by your health care provider. Make sure you discuss any questions you have with your health care provider. Document Released:  12/09/2007 Document Revised: 07/09/2016 Document Reviewed: 07/09/2016 Elsevier Interactive Patient Education  2017 Elsevier Inc.      Agustina Caroli, MD [facility]

## 2016-10-22 DIAGNOSIS — H40053 Ocular hypertension, bilateral: Secondary | ICD-10-CM | POA: Diagnosis not present

## 2016-10-22 DIAGNOSIS — H401131 Primary open-angle glaucoma, bilateral, mild stage: Secondary | ICD-10-CM | POA: Diagnosis not present

## 2016-11-12 DIAGNOSIS — H401111 Primary open-angle glaucoma, right eye, mild stage: Secondary | ICD-10-CM | POA: Diagnosis not present

## 2016-11-26 DIAGNOSIS — H401121 Primary open-angle glaucoma, left eye, mild stage: Secondary | ICD-10-CM | POA: Diagnosis not present

## 2017-02-01 DIAGNOSIS — H40052 Ocular hypertension, left eye: Secondary | ICD-10-CM | POA: Diagnosis not present

## 2017-02-01 DIAGNOSIS — E113293 Type 2 diabetes mellitus with mild nonproliferative diabetic retinopathy without macular edema, bilateral: Secondary | ICD-10-CM | POA: Diagnosis not present

## 2017-02-01 DIAGNOSIS — H35033 Hypertensive retinopathy, bilateral: Secondary | ICD-10-CM | POA: Diagnosis not present

## 2017-02-01 DIAGNOSIS — H401131 Primary open-angle glaucoma, bilateral, mild stage: Secondary | ICD-10-CM | POA: Diagnosis not present

## 2017-02-24 DIAGNOSIS — M1000 Idiopathic gout, unspecified site: Secondary | ICD-10-CM | POA: Diagnosis not present

## 2017-02-24 DIAGNOSIS — H409 Unspecified glaucoma: Secondary | ICD-10-CM | POA: Diagnosis not present

## 2017-02-24 DIAGNOSIS — E784 Other hyperlipidemia: Secondary | ICD-10-CM | POA: Diagnosis not present

## 2017-02-24 DIAGNOSIS — E119 Type 2 diabetes mellitus without complications: Secondary | ICD-10-CM | POA: Diagnosis not present

## 2017-02-24 DIAGNOSIS — M5430 Sciatica, unspecified side: Secondary | ICD-10-CM | POA: Diagnosis not present

## 2017-02-24 DIAGNOSIS — I10 Essential (primary) hypertension: Secondary | ICD-10-CM | POA: Diagnosis not present

## 2017-03-04 DIAGNOSIS — H40052 Ocular hypertension, left eye: Secondary | ICD-10-CM | POA: Diagnosis not present

## 2017-03-04 DIAGNOSIS — H15103 Unspecified episcleritis, bilateral: Secondary | ICD-10-CM | POA: Diagnosis not present

## 2017-03-04 DIAGNOSIS — H401131 Primary open-angle glaucoma, bilateral, mild stage: Secondary | ICD-10-CM | POA: Diagnosis not present

## 2017-03-04 DIAGNOSIS — H1013 Acute atopic conjunctivitis, bilateral: Secondary | ICD-10-CM | POA: Diagnosis not present

## 2017-04-07 DIAGNOSIS — E119 Type 2 diabetes mellitus without complications: Secondary | ICD-10-CM | POA: Diagnosis not present

## 2017-04-07 DIAGNOSIS — L723 Sebaceous cyst: Secondary | ICD-10-CM | POA: Diagnosis not present

## 2017-04-07 DIAGNOSIS — I10 Essential (primary) hypertension: Secondary | ICD-10-CM | POA: Diagnosis not present

## 2017-04-07 DIAGNOSIS — Z23 Encounter for immunization: Secondary | ICD-10-CM | POA: Diagnosis not present

## 2017-04-07 DIAGNOSIS — E7849 Other hyperlipidemia: Secondary | ICD-10-CM | POA: Diagnosis not present

## 2017-04-07 DIAGNOSIS — M5430 Sciatica, unspecified side: Secondary | ICD-10-CM | POA: Diagnosis not present

## 2017-04-21 DIAGNOSIS — H1013 Acute atopic conjunctivitis, bilateral: Secondary | ICD-10-CM | POA: Diagnosis not present

## 2017-04-21 DIAGNOSIS — H40052 Ocular hypertension, left eye: Secondary | ICD-10-CM | POA: Diagnosis not present

## 2017-04-21 DIAGNOSIS — H401111 Primary open-angle glaucoma, right eye, mild stage: Secondary | ICD-10-CM | POA: Diagnosis not present

## 2017-04-21 DIAGNOSIS — H401122 Primary open-angle glaucoma, left eye, moderate stage: Secondary | ICD-10-CM | POA: Diagnosis not present

## 2017-06-11 ENCOUNTER — Other Ambulatory Visit: Payer: Self-pay | Admitting: Internal Medicine

## 2017-06-11 DIAGNOSIS — Z1231 Encounter for screening mammogram for malignant neoplasm of breast: Secondary | ICD-10-CM

## 2017-06-15 ENCOUNTER — Ambulatory Visit: Payer: PPO

## 2017-06-30 ENCOUNTER — Ambulatory Visit
Admission: RE | Admit: 2017-06-30 | Discharge: 2017-06-30 | Disposition: A | Discharge status: Home / Self Care | Payer: PPO | Source: Ambulatory Visit | Attending: Internal Medicine | Admitting: Internal Medicine

## 2017-06-30 DIAGNOSIS — Z1231 Encounter for screening mammogram for malignant neoplasm of breast: Secondary | ICD-10-CM | POA: Diagnosis not present

## 2017-06-30 HISTORY — DX: Personal history of antineoplastic chemotherapy: Z92.21

## 2017-06-30 HISTORY — DX: Personal history of irradiation: Z92.3

## 2018-01-26 DIAGNOSIS — Z961 Presence of intraocular lens: Secondary | ICD-10-CM | POA: Insufficient documentation

## 2018-02-23 DIAGNOSIS — H401112 Primary open-angle glaucoma, right eye, moderate stage: Secondary | ICD-10-CM | POA: Diagnosis present

## 2018-02-23 DIAGNOSIS — H401123 Primary open-angle glaucoma, left eye, severe stage: Secondary | ICD-10-CM | POA: Diagnosis present

## 2018-06-23 ENCOUNTER — Other Ambulatory Visit: Payer: Self-pay | Admitting: Internal Medicine

## 2018-06-23 DIAGNOSIS — Z1231 Encounter for screening mammogram for malignant neoplasm of breast: Secondary | ICD-10-CM

## 2018-08-01 ENCOUNTER — Ambulatory Visit: Payer: PPO

## 2018-08-01 ENCOUNTER — Other Ambulatory Visit: Payer: Self-pay | Admitting: Internal Medicine

## 2018-08-01 ENCOUNTER — Ambulatory Visit
Admission: RE | Admit: 2018-08-01 | Discharge: 2018-08-01 | Disposition: A | Discharge status: Home / Self Care | Payer: Medicare Other | Source: Ambulatory Visit | Attending: Internal Medicine | Admitting: Internal Medicine

## 2018-08-01 DIAGNOSIS — Z1231 Encounter for screening mammogram for malignant neoplasm of breast: Secondary | ICD-10-CM

## 2018-08-25 ENCOUNTER — Ambulatory Visit (INDEPENDENT_AMBULATORY_CARE_PROVIDER_SITE_OTHER): Payer: Medicare Other

## 2018-08-25 ENCOUNTER — Ambulatory Visit (HOSPITAL_COMMUNITY)
Admission: EM | Admit: 2018-08-25 | Discharge: 2018-08-25 | Disposition: A | Discharge status: Home / Self Care | Payer: Medicare Other | Attending: Family Medicine | Admitting: Family Medicine

## 2018-08-25 ENCOUNTER — Encounter (HOSPITAL_COMMUNITY): Payer: Self-pay

## 2018-08-25 DIAGNOSIS — R109 Unspecified abdominal pain: Secondary | ICD-10-CM

## 2018-08-25 DIAGNOSIS — M25561 Pain in right knee: Secondary | ICD-10-CM

## 2018-08-25 DIAGNOSIS — M5432 Sciatica, left side: Secondary | ICD-10-CM

## 2018-08-25 DIAGNOSIS — M1990 Unspecified osteoarthritis, unspecified site: Secondary | ICD-10-CM | POA: Diagnosis not present

## 2018-08-25 DIAGNOSIS — M79671 Pain in right foot: Secondary | ICD-10-CM | POA: Diagnosis not present

## 2018-08-25 MED ORDER — DICLOFENAC SODIUM 1 % TD GEL: 4.0000 g | Freq: Four times a day (QID) | TRANSDERMAL | 1 refills | Status: DC

## 2018-08-25 MED ORDER — ACETAMINOPHEN 500 MG PO TABS: 500.0000 mg | ORAL_TABLET | Freq: Four times a day (QID) | ORAL | 0 refills | Status: DC | PRN

## 2018-08-25 NOTE — ED Provider Notes (Signed)
[facility]    CSN: [id number] Arrival date & time: 08/25/18  1405     History   Chief Complaint Chief Complaint  Patient presents with  . Foot Pain  . Leg Pain    HPI [name] is a 85 y.o. female.   Pt is an 85 year old female that presents with right knee pain and right foot pain. This has been ongoing for many months and worsening. She has not had any injuries or falls. Walking makes the pain worse. She has not taken anything for the pain. No calf pain or tenderness. No numbness tingling or loss of sensations. Without fever or erythema.   ROS per HPI      Past Medical History:  Diagnosis Date  . Anemia   . Breast cancer ([place])   . Cataract   . Diabetes mellitus without complication ([place])   . Glaucoma   . High cholesterol   . Hypertension   . Personal history of chemotherapy   . Personal history of radiation therapy     Patient Active Problem List   Diagnosis Date Noted  . Lower abdominal pain 10/21/2016  . Chronic hip pain 10/21/2016  . Syncope 01/27/2014    Past Surgical History:  Procedure Laterality Date  . ABDOMINAL HYSTERECTOMY    . APPENDECTOMY    . BREAST LUMPECTOMY Right   . EYE SURGERY      OB History   No obstetric history on file.      Home Medications    Prior to Admission medications   Medication Sig Start Date End Date Taking? Authorizing Provider  acetaminophen (TYLENOL) 500 MG tablet Take 1 tablet (500 mg total) by mouth every 6 (six) hours as needed. 08/25/18   Loura Halt A, NP  amLODipine (NORVASC) 5 MG tablet Take 5 mg by mouth daily.    [provider]  aspirin EC 81 MG tablet Take 81 mg by mouth at bedtime. Reported on 09/27/2015    [provider]  diclofenac sodium (VOLTAREN) 1 % GEL Apply 4 g topically 4 (four) times daily. 08/25/18   Loura Halt A, NP  valsartan (DIOVAN) 160 MG tablet Take 1 tablet (160 mg total) by mouth daily. 01/28/14   Charolette Forward, MD    Family History Family  History  Problem Relation Age of Onset  . Hyperlipidemia Sister   . Diabetes Brother   . Hyperlipidemia Brother   . Diabetes Maternal Grandmother   . Stroke Maternal Grandfather   . Diabetes Brother   . Breast cancer Neg Hx     Social History Social History   Tobacco Use  . Smoking status: Never Smoker  . Smokeless tobacco: Never Used  Substance Use Topics  . Alcohol use: No  . Drug use: No     Allergies   Patient has no known allergies.   Review of Systems Review of Systems   Physical Exam Triage Vital Signs ED Triage Vitals  Enc Vitals Group     BP 08/25/18 1434 (!) 172/60     Pulse Rate 08/25/18 1434 75     Resp 08/25/18 1434 18     Temp 08/25/18 1434 98 F (36.7 C)     Temp Source 08/25/18 1434 Oral     SpO2 08/25/18 1434 96 %     Weight --      Height --      Head Circumference --      Peak Flow --  Pain Score 08/25/18 1437 8     Pain Loc --      Pain Edu? --      Excl. in [place]? --    No data found.  Updated Vital Signs BP (!) 172/60 (BP Location: Right Arm)   Pulse 75   Temp 98 F (36.7 C) (Oral)   Resp 18   SpO2 96%   Visual Acuity Right Eye Distance:   Left Eye Distance:   Bilateral Distance:    Right Eye Near:   Left Eye Near:    Bilateral Near:     Physical Exam Vitals signs and nursing note reviewed.  Constitutional:      General: She is not in acute distress.    Appearance: Normal appearance. She is not ill-appearing, toxic-appearing or diaphoretic.  HENT:     Head: Normocephalic and atraumatic.     Nose: Nose normal.  Neck:     Musculoskeletal: Normal range of motion.  Pulmonary:     Effort: Pulmonary effort is normal.  Musculoskeletal:        General: Swelling and tenderness present. No deformity or signs of injury.     Right lower leg: No edema.     Left lower leg: No edema.     Comments: Tenderness to palpation of the entire right patella generalized swelling. Able to flex and extend with minimal pain. No calf  pain, tenderness or swelling. No posterior knee pain.   Tenderness to palpation of the right foot at the head of the metatarsals into the mid foot. No swelling, erythema, deformity. Mild tenderness to the ball of foot.   Pulse and sensation intact.   Skin:    General: Skin is warm and dry.  Neurological:     Mental Status: She is alert.  Psychiatric:        Mood and Affect: Mood normal.      UC Treatments / Results  Labs (all labs ordered are listed, but only abnormal results are displayed) Labs Reviewed - No data to display  EKG None  Radiology Dg Knee Complete 4 Views Right  Result Date: 08/25/2018 CLINICAL DATA:  85 year old female with a history of right knee pain EXAM: RIGHT KNEE - COMPLETE 4+ VIEW COMPARISON:  None. FINDINGS: No acute displaced fracture. No joint effusion. No focal soft tissue swelling. No radiopaque foreign body. Vascular calcifications. Mild medial joint space narrowing. Degenerative changes of the patellofemoral joint. IMPRESSION: Negative for acute bony abnormality. Mild osteoarthritis Electronically Signed   By: Corrie Mckusick D.O.   On: 08/25/2018 15:33   Dg Foot Complete Right  Result Date: 08/25/2018 CLINICAL DATA:  Pain in toes. EXAM: RIGHT FOOT COMPLETE - 3+ VIEW COMPARISON:  None. FINDINGS: No fracture deformity or dislocation. Similar mild first MTP similar osteoarthrosis. Subluxed tibial first metatarsal sesamoid with undersurface spurring is unchanged. Small os peroneum. Small plantar calcaneal spur. Moderate vascular calcifications. IMPRESSION: No acute osseous process. Stable first MTP osteoarthrosis. Electronically Signed   By: Elon Alas M.D.   On: 08/25/2018 15:45    Procedures Procedures (including critical care time)  Medications Ordered in UC Medications - No data to display  Initial Impression / Assessment and Plan / UC Course  I have reviewed the triage vital signs and the nursing notes.  Pertinent labs & imaging results  that were available during my care of the patient were reviewed by me and considered in my medical decision making (see chart for details).    85 year old female  presents with chronic right knee and foot pain.  Both  xrays revealed arthritis which explains the patients pain. She has a hx of arthritis. Treating with Voltaren gel and extra strength tylenol.  Patient understanding to plan.  Follow up as needed for continued or worsening symptoms  Final Clinical Impressions(s) / UC Diagnoses   Final diagnoses:  Arthritis     Discharge Instructions     Both x-rays revealed arthritis We will treat this with Voltaren gel and extra strength tylenol.  Follow-up with your doctor for any continued or worsening symptoms    ED Prescriptions    Medication Sig Dispense Auth. Provider   diclofenac sodium (VOLTAREN) 1 % GEL Apply 4 g topically 4 (four) times daily. 1 Tube Koryn Charlot A, NP   acetaminophen (TYLENOL) 500 MG tablet Take 1 tablet (500 mg total) by mouth every 6 (six) hours as needed. 30 tablet Orvan July, NP     Controlled Substance Prescriptions [place] Controlled Substance Registry consulted? no   Orvan July, NP 08/26/18 1429

## 2018-08-25 NOTE — Discharge Instructions (Addendum)
Both x-rays revealed arthritis We will treat this with Voltaren gel and extra strength tylenol.  Follow-up with your doctor for any continued or worsening symptoms

## 2018-08-25 NOTE — ED Triage Notes (Signed)
Pt presents with right foot & leg pain and swelling that has been ongoing for awhile and progressively getting worse in the past few days.

## 2019-07-17 ENCOUNTER — Other Ambulatory Visit: Payer: Self-pay | Admitting: Internal Medicine

## 2019-07-17 DIAGNOSIS — Z1231 Encounter for screening mammogram for malignant neoplasm of breast: Secondary | ICD-10-CM

## 2019-08-24 ENCOUNTER — Ambulatory Visit
Admission: RE | Admit: 2019-08-24 | Discharge: 2019-08-24 | Disposition: A | Discharge status: Home / Self Care | Payer: Medicare Other | Source: Ambulatory Visit | Attending: Internal Medicine | Admitting: Internal Medicine

## 2019-08-24 ENCOUNTER — Other Ambulatory Visit: Payer: Self-pay

## 2019-08-24 DIAGNOSIS — Z1231 Encounter for screening mammogram for malignant neoplasm of breast: Secondary | ICD-10-CM

## 2020-01-16 ENCOUNTER — Other Ambulatory Visit: Payer: Self-pay

## 2020-01-16 ENCOUNTER — Encounter (HOSPITAL_COMMUNITY): Payer: Self-pay

## 2020-01-16 ENCOUNTER — Emergency Department (HOSPITAL_COMMUNITY)
Admission: EM | Admit: 2020-01-16 | Discharge: 2020-01-17 | Disposition: A | Discharge status: Home / Self Care | Payer: Medicare Other | Attending: Emergency Medicine | Admitting: Emergency Medicine

## 2020-01-16 DIAGNOSIS — M5441 Lumbago with sciatica, right side: Secondary | ICD-10-CM | POA: Diagnosis not present

## 2020-01-16 DIAGNOSIS — Z7982 Long term (current) use of aspirin: Secondary | ICD-10-CM | POA: Insufficient documentation

## 2020-01-16 DIAGNOSIS — I10 Essential (primary) hypertension: Secondary | ICD-10-CM | POA: Insufficient documentation

## 2020-01-16 DIAGNOSIS — M545 Low back pain: Secondary | ICD-10-CM | POA: Diagnosis present

## 2020-01-16 DIAGNOSIS — E119 Type 2 diabetes mellitus without complications: Secondary | ICD-10-CM | POA: Diagnosis not present

## 2020-01-16 LAB — URINALYSIS, ROUTINE W REFLEX MICROSCOPIC
Bacteria, UA: NONE SEEN
Bilirubin Urine: NEGATIVE
Glucose, UA: NEGATIVE mg/dL
Hgb urine dipstick: NEGATIVE
Ketones, ur: NEGATIVE mg/dL
Nitrite: NEGATIVE
Protein, ur: NEGATIVE mg/dL
Specific Gravity, Urine: 1.018 (ref 1.005–1.030)
pH: 6 (ref 5.0–8.0)

## 2020-01-16 LAB — CBC
HCT: 38.2 % (ref 36.0–46.0)
Hemoglobin: 12 g/dL (ref 12.0–15.0)
MCH: 30.2 pg (ref 26.0–34.0)
MCHC: 31.4 g/dL (ref 30.0–36.0)
MCV: 96.2 fL (ref 80.0–100.0)
Platelets: 245 10*3/uL (ref 150–400)
RBC: 3.97 MIL/uL (ref 3.87–5.11)
RDW: 15 % (ref 11.5–15.5)
WBC: 8.2 10*3/uL (ref 4.0–10.5)
nRBC: 0 % (ref 0.0–0.2)

## 2020-01-16 LAB — LIPASE, BLOOD: Lipase: 29 U/L (ref 11–51)

## 2020-01-16 LAB — COMPREHENSIVE METABOLIC PANEL
ALT: 14 U/L (ref 0–44)
AST: 53 U/L — ABNORMAL HIGH (ref 15–41)
Albumin: 3.6 g/dL (ref 3.5–5.0)
Alkaline Phosphatase: 57 U/L (ref 38–126)
Anion gap: 9 (ref 5–15)
BUN: 14 mg/dL (ref 8–23)
CO2: 25 mmol/L (ref 22–32)
Calcium: 9.6 mg/dL (ref 8.9–10.3)
Chloride: 106 mmol/L (ref 98–111)
Creatinine, Ser: 0.98 mg/dL (ref 0.44–1.00)
GFR calc Af Amer: 59 mL/min — ABNORMAL LOW (ref >60)
GFR calc non Af Amer: 51 mL/min — ABNORMAL LOW (ref >60)
Glucose, Bld: 110 mg/dL — ABNORMAL HIGH (ref 70–99)
Potassium: 4.7 mmol/L (ref 3.5–5.1)
Sodium: 140 mmol/L (ref 135–145)
Total Bilirubin: 1.2 mg/dL (ref 0.3–1.2)
Total Protein: 7 g/dL (ref 6.5–8.1)

## 2020-01-16 NOTE — ED Triage Notes (Signed)
Pt reports lower back pain that radiates to her hips for the past 2 days, no urinary s/s, normal BMs. Pain improves when pt bends over

## 2020-01-17 ENCOUNTER — Emergency Department (HOSPITAL_COMMUNITY): Payer: Medicare Other

## 2020-01-17 MED ORDER — NAPROXEN 375 MG PO TABS: 375.0000 mg | ORAL_TABLET | Freq: Two times a day (BID) | ORAL | 0 refills | Status: DC

## 2020-01-17 MED ORDER — IBUPROFEN 200 MG PO TABS
200.0000 mg | ORAL_TABLET | Freq: Once | ORAL | Status: AC
  Administered 2020-01-17: 200 mg via ORAL
  Filled 2020-01-17: qty 1

## 2020-01-17 NOTE — ED Provider Notes (Signed)
[facility] EMERGENCY DEPARTMENT Provider Note   CSN: [id number] Arrival date & time: 01/16/20  1701     History Chief Complaint  Patient presents with  . Back Pain    [name] is a 86 y.o. female.  She is complaining of right-sided low back pain that started 5 days ago.  New for patient.  Worse with getting up from laying to sitting and sitting to standing.  Radiates down her right thigh.  Not associated with any numbness or weakness.  No urinary symptoms.  No trauma.  No recent falls.  Has tried nothing for it.  Had prior been prescribed Voltaren gel for pain but says it does not do anything so does not use it.  No fevers or chills.  The history is provided by the patient.  Back Pain Location:  Gluteal region Quality:  Stabbing Radiates to:  R thigh Pain severity:  Moderate Pain is:  Worse during the day Onset quality:  Gradual Duration:  5 days Timing:  Intermittent Progression:  Unchanged Chronicity:  New Context: not falling, not recent illness and not recent injury   Relieved by:  Nothing Worsened by:  Bending, twisting and movement Ineffective treatments:  OTC medications Associated symptoms: no abdominal pain, no bladder incontinence, no bowel incontinence, no chest pain, no dysuria, no fever, no numbness, no paresthesias and no weakness   Risk factors: hx of cancer        Past Medical History:  Diagnosis Date  . Anemia   . Breast cancer ([place]) 2002   Right  . Cataract   . Diabetes mellitus without complication ([place])   . Glaucoma   . High cholesterol   . Hypertension   . Personal history of chemotherapy   . Personal history of radiation therapy     Patient Active Problem List   Diagnosis Date Noted  . Lower abdominal pain 10/21/2016  . Chronic hip pain 10/21/2016  . Syncope 01/27/2014    Past Surgical History:  Procedure Laterality Date  . ABDOMINAL HYSTERECTOMY    . APPENDECTOMY    . BREAST LUMPECTOMY Right   . EYE  SURGERY       OB History   No obstetric history on file.     Family History  Problem Relation Age of Onset  . Hyperlipidemia Sister   . Diabetes Brother   . Hyperlipidemia Brother   . Diabetes Maternal Grandmother   . Stroke Maternal Grandfather   . Diabetes Brother   . Breast cancer Neg Hx     Social History   Tobacco Use  . Smoking status: Never Smoker  . Smokeless tobacco: Never Used  Substance Use Topics  . Alcohol use: No  . Drug use: No    Home Medications Prior to Admission medications   Medication Sig Start Date End Date Taking? Authorizing Provider  acetaminophen (TYLENOL) 500 MG tablet Take 1 tablet (500 mg total) by mouth every 6 (six) hours as needed. 08/25/18   Loura Halt A, NP  amLODipine (NORVASC) 5 MG tablet Take 5 mg by mouth daily.    [provider]  aspirin EC 81 MG tablet Take 81 mg by mouth at bedtime. Reported on 09/27/2015    [provider]  diclofenac sodium (VOLTAREN) 1 % GEL Apply 4 g topically 4 (four) times daily. 08/25/18   Loura Halt A, NP  valsartan (DIOVAN) 160 MG tablet Take 1 tablet (160 mg total) by mouth daily. 01/28/14   Charolette Forward,  MD    Allergies    Patient has no known allergies.  Review of Systems   Review of Systems  Constitutional: Negative for fever.  HENT: Negative for sore throat.   Eyes: Negative for visual disturbance.  Respiratory: Negative for shortness of breath.   Cardiovascular: Negative for chest pain.  Gastrointestinal: Negative for abdominal pain and bowel incontinence.  Genitourinary: Negative for bladder incontinence and dysuria.  Musculoskeletal: Positive for back pain.  Skin: Negative for rash.  Neurological: Negative for weakness, numbness and paresthesias.    Physical Exam Updated Vital Signs BP (!) 159/69 (BP Location: Left Arm)   Pulse 67   Temp 97.7 F (36.5 C) (Oral)   Resp 16   Ht 5' (1.524 m)   Wt 67.6 kg   SpO2 100%   BMI 29.10 kg/m   Physical Exam Vitals  and nursing note reviewed.  Constitutional:      General: She is not in acute distress.    Appearance: She is well-developed.  HENT:     Head: Normocephalic and atraumatic.  Eyes:     Conjunctiva/sclera: Conjunctivae normal.  Cardiovascular:     Rate and Rhythm: Normal rate and regular rhythm.     Heart sounds: No murmur heard.   Pulmonary:     Effort: Pulmonary effort is normal. No respiratory distress.     Breath sounds: Normal breath sounds.  Abdominal:     Palpations: Abdomen is soft.     Tenderness: There is no abdominal tenderness.  Musculoskeletal:     Cervical back: Neck supple.     Right lower leg: Edema present.     Left lower leg: Edema present.     Comments: She is nontender thoracic and lumbar spine midline.  Tender through right gluteal area reproducing her pain.  Skin:    General: Skin is warm and dry.     Capillary Refill: Capillary refill takes less than 2 seconds.  Neurological:     General: No focal deficit present.     Mental Status: She is alert.     Sensory: No sensory deficit.     Motor: No weakness.     Comments: She is able to raise her legs up off the bed.  Good distal motor strength and sensation.  Distal pulses intact.     ED Results / Procedures / Treatments   Labs (all labs ordered are listed, but only abnormal results are displayed) Labs Reviewed  COMPREHENSIVE METABOLIC PANEL - Abnormal; Notable for the following components:      Result Value   Glucose, Bld 110 (*)    AST 53 (*)    GFR calc non Af Amer 51 (*)    GFR calc Af Amer 59 (*)    All other components within normal limits  URINALYSIS, ROUTINE W REFLEX MICROSCOPIC - Abnormal; Notable for the following components:   Leukocytes,Ua TRACE (*)    All other components within normal limits  LIPASE, BLOOD  CBC    EKG None  Radiology DG Lumbar Spine Complete  Result Date: 01/17/2020 CLINICAL DATA:  Back pain. EXAM: LUMBAR SPINE - COMPLETE 4+ VIEW COMPARISON:  10/20/2007  FINDINGS: Thoracolumbar scoliosis and degenerative lumbar spondylosis with multilevel disc disease and facet disease. Stable multilevel degenerative listhesis. No acute bony findings or destructive bony changes. Vascular calcifications are noted. IMPRESSION: Scoliosis and degenerative lumbar spondylosis with multilevel disc disease and facet disease. Electronically Signed   By: Marijo Sanes M.D.   On: 01/17/2020 08:56  Procedures Procedures (including critical care time)  Medications Ordered in ED Medications  ibuprofen (ADVIL) tablet 200 mg (200 mg Oral Given 01/17/20 0935)    ED Course  I have reviewed the triage vital signs and the nursing notes.  Pertinent labs & imaging results that were available during my care of the patient were reviewed by me and considered in my medical decision making (see chart for details).  Clinical Course as of Jan 17 1839  Wed Jan 17, 2020  0901 X-ray showing some scoliosis and degenerative disease.   [MB]  1000 Patient's work-up has been fairly unremarkable other than her x-ray findings.  We will have her do NSAIDs for now and have close follow-up with her PCP.  Her son is here with her and will monitor the situation.   [MB]    Clinical Course User Index [MB] Hayden Rasmussen, MD   MDM Rules/Calculators/A&P                         This patient complains of lumbar back pain pain and right gluteus and right thigh; this involves an extensive number of treatment Options and is a complaint that carries with it a high risk of complications and Morbidity. The differential includes musculoskeletal strain, lumbar fracture, radiculopathy, spinal stenosis  I ordered, reviewed and interpreted labs, which included CBC with normal white count normal hemoglobin, chemistries with normal chemistries other than elevated glucose, LFTs with mild elevation of AST otherwise normal, urinalysis without signs of infection I ordered medication ibuprofen with some  improvement in her pain I ordered imaging studies which included lumbar spine x-ray and I independently    visualized and interpreted imaging which showed scoliosis and degenerative changes Additional history obtained from patient's son Previous records obtained and reviewed in epic, no recent admissions  After the interventions stated above, I reevaluated the patient and found patient to be neurologically intact.  Discussed NSAIDs.  Hesitant to put on muscle relaxant or opiates until have exhausted other less sedating options.  Recommended PCP follow-up.  Return instructions discussed.   Final Clinical Impression(s) / ED Diagnoses Final diagnoses:  Acute right-sided low back pain with right-sided sciatica    Rx / DC Orders ED Discharge Orders         Ordered    naproxen (NAPROSYN) 375 MG tablet  2 times daily     Discontinue  Reprint     01/17/20 1001           Hayden Rasmussen, MD 01/17/20 1842

## 2020-01-17 NOTE — Discharge Instructions (Signed)
You were seen in the emergency department for worsening right-sided low back pain.  You had blood work and a urinalysis that did not show any serious abnormalities.  Your x-ray showed a lot of arthritis changes.  Please use naproxen twice a day with food on your stomach.  Contact your primary care doctor for close follow-up.  Return to the emergency department for any worsening or concerning symptoms.

## 2020-01-24 ENCOUNTER — Emergency Department (HOSPITAL_COMMUNITY)
Admission: EM | Admit: 2020-01-24 | Discharge: 2020-01-24 | Disposition: A | Discharge status: Home / Self Care | Payer: Medicare Other | Attending: Emergency Medicine | Admitting: Emergency Medicine

## 2020-01-24 ENCOUNTER — Emergency Department (HOSPITAL_COMMUNITY): Payer: Medicare Other

## 2020-01-24 ENCOUNTER — Other Ambulatory Visit: Payer: Self-pay

## 2020-01-24 ENCOUNTER — Encounter (HOSPITAL_COMMUNITY): Payer: Self-pay

## 2020-01-24 DIAGNOSIS — I10 Essential (primary) hypertension: Secondary | ICD-10-CM | POA: Insufficient documentation

## 2020-01-24 DIAGNOSIS — E119 Type 2 diabetes mellitus without complications: Secondary | ICD-10-CM | POA: Insufficient documentation

## 2020-01-24 DIAGNOSIS — Z7982 Long term (current) use of aspirin: Secondary | ICD-10-CM | POA: Diagnosis not present

## 2020-01-24 DIAGNOSIS — R519 Headache, unspecified: Secondary | ICD-10-CM

## 2020-01-24 DIAGNOSIS — Z79899 Other long term (current) drug therapy: Secondary | ICD-10-CM | POA: Diagnosis not present

## 2020-01-24 DIAGNOSIS — Z853 Personal history of malignant neoplasm of breast: Secondary | ICD-10-CM | POA: Diagnosis not present

## 2020-01-24 DIAGNOSIS — R030 Elevated blood-pressure reading, without diagnosis of hypertension: Secondary | ICD-10-CM

## 2020-01-24 LAB — CBC WITH DIFFERENTIAL/PLATELET
Abs Immature Granulocytes: 0.02 10*3/uL (ref 0.00–0.07)
Basophils Absolute: 0 10*3/uL (ref 0.0–0.1)
Basophils Relative: 0 %
Eosinophils Absolute: 0.2 10*3/uL (ref 0.0–0.5)
Eosinophils Relative: 2 %
HCT: 39.8 % (ref 36.0–46.0)
Hemoglobin: 12.7 g/dL (ref 12.0–15.0)
Immature Granulocytes: 0 %
Lymphocytes Relative: 21 %
Lymphs Abs: 2.1 10*3/uL (ref 0.7–4.0)
MCH: 30.8 pg (ref 26.0–34.0)
MCHC: 31.9 g/dL (ref 30.0–36.0)
MCV: 96.4 fL (ref 80.0–100.0)
Monocytes Absolute: 0.9 10*3/uL (ref 0.1–1.0)
Monocytes Relative: 9 %
Neutro Abs: 6.7 10*3/uL (ref 1.7–7.7)
Neutrophils Relative %: 68 %
Platelets: 241 10*3/uL (ref 150–400)
RBC: 4.13 MIL/uL (ref 3.87–5.11)
RDW: 14.9 % (ref 11.5–15.5)
WBC: 10 10*3/uL (ref 4.0–10.5)
nRBC: 0 % (ref 0.0–0.2)

## 2020-01-24 LAB — COMPREHENSIVE METABOLIC PANEL
ALT: 14 U/L (ref 0–44)
AST: 54 U/L — ABNORMAL HIGH (ref 15–41)
Albumin: 4.2 g/dL (ref 3.5–5.0)
Alkaline Phosphatase: 58 U/L (ref 38–126)
Anion gap: 10 (ref 5–15)
BUN: 17 mg/dL (ref 8–23)
CO2: 24 mmol/L (ref 22–32)
Calcium: 9.9 mg/dL (ref 8.9–10.3)
Chloride: 106 mmol/L (ref 98–111)
Creatinine, Ser: 0.81 mg/dL (ref 0.44–1.00)
GFR calc Af Amer: >60 mL/min (ref >60)
GFR calc non Af Amer: >60 mL/min (ref >60)
Glucose, Bld: 124 mg/dL — ABNORMAL HIGH (ref 70–99)
Potassium: 4.7 mmol/L (ref 3.5–5.1)
Sodium: 140 mmol/L (ref 135–145)
Total Bilirubin: 0.9 mg/dL (ref 0.3–1.2)
Total Protein: 7.9 g/dL (ref 6.5–8.1)

## 2020-01-24 MED ORDER — ACETAMINOPHEN 325 MG PO TABS
650.0000 mg | ORAL_TABLET | Freq: Once | ORAL | Status: AC
  Administered 2020-01-24: 650 mg via ORAL
  Filled 2020-01-24: qty 2

## 2020-01-24 NOTE — ED Notes (Signed)
Pt is ambulatory to the restroom without assistance.

## 2020-01-24 NOTE — ED Triage Notes (Signed)
Pt sts high blood pressure at home that was 200/101. Pt has a headache.

## 2020-01-24 NOTE — ED Provider Notes (Signed)
[facility] DEPT Provider Note   CSN: [id number] Arrival date & time: 01/24/20  0158     History Chief Complaint  Patient presents with  . Hypertension    [name] is a 86 y.o. female with a past medical history significant for anemia, history of breast cancer, diabetes, glaucoma, hyperlipidemia, and hypertension who presents to the ED due to elevated blood pressure.  Patient states her blood pressure has been elevated for the past 24 hours which was improving last night at 10 PM; however, she took her blood pressure at 1 AM this morning which was 200/101 which prompted her to report to the ED for further evaluation.  Patient notes she was recently stopped on amlodipine 5 mg 2 weeks ago; however, her PCP told her to restart her amlodipine yesterday due to her elevated blood pressure.  She also takes valsartan 160 mg daily.  Patient notes she took her amlodipine and valsartan yesterday around 4 or 5 PM.  She also notes she took another half of amlodipine a few hours later.  Patient admits to an intermittent mild to moderate bilateral frontal headache for the past week.  Denies visual changes.  Denies nausea vomiting.  No treatment prior to arrival.  Denies worst headache of her life and worst intensity at onset.  Denies unilateral weakness, changes to speech, and facial droop.  Patient also admits to intermittent dizziness for the past few days.  Patient describes dizziness as feeling off balance when she stands up or moves too quickly.  Denies any head injury or syncopal episodes.  Denies chest pain or shortness of breath.  History obtained from patient and past medical records. No interpreter used during encounter.      Past Medical History:  Diagnosis Date  . Anemia   . Breast cancer ([place]) 2002   Right  . Cataract   . Diabetes mellitus without complication ([place])   . Glaucoma   . High cholesterol   . Hypertension   . Personal history of  chemotherapy   . Personal history of radiation therapy     Patient Active Problem List   Diagnosis Date Noted  . Lower abdominal pain 10/21/2016  . Chronic hip pain 10/21/2016  . Syncope 01/27/2014    Past Surgical History:  Procedure Laterality Date  . ABDOMINAL HYSTERECTOMY    . APPENDECTOMY    . BREAST LUMPECTOMY Right   . EYE SURGERY       OB History   No obstetric history on file.     Family History  Problem Relation Age of Onset  . Hyperlipidemia Sister   . Diabetes Brother   . Hyperlipidemia Brother   . Diabetes Maternal Grandmother   . Stroke Maternal Grandfather   . Diabetes Brother   . Breast cancer Neg Hx     Social History   Tobacco Use  . Smoking status: Never Smoker  . Smokeless tobacco: Never Used  Substance Use Topics  . Alcohol use: No  . Drug use: No    Home Medications Prior to Admission medications   Medication Sig Start Date End Date Taking? Authorizing Provider  acetaminophen (TYLENOL) 500 MG tablet Take 1 tablet (500 mg total) by mouth every 6 (six) hours as needed. 08/25/18   Loura Halt A, NP  amLODipine (NORVASC) 5 MG tablet Take 5 mg by mouth daily.    [provider]  aspirin EC 81 MG tablet Take 81 mg by mouth at bedtime. Reported on 09/27/2015  [provider]  diclofenac sodium (VOLTAREN) 1 % GEL Apply 4 g topically 4 (four) times daily. 08/25/18   Loura Halt A, NP  naproxen (NAPROSYN) 375 MG tablet Take 1 tablet (375 mg total) by mouth 2 (two) times daily. 01/17/20   Hayden Rasmussen, MD  valsartan (DIOVAN) 160 MG tablet Take 1 tablet (160 mg total) by mouth daily. 01/28/14   Charolette Forward, MD    Allergies    Patient has no known allergies.  Review of Systems   Review of Systems  Constitutional: Negative for chills and fever.  Respiratory: Negative for shortness of breath.   Cardiovascular: Negative for chest pain.  Gastrointestinal: Negative for abdominal pain, nausea and vomiting.  Neurological:  Positive for dizziness and headaches. Negative for facial asymmetry, speech difficulty, weakness and numbness.  All other systems reviewed and are negative.   Physical Exam Updated Vital Signs BP (!) 170/78   Pulse 82   Temp 98.4 F (36.9 C) (Oral)   Resp 16   Ht 5' (1.524 m)   Wt 67.5 kg   SpO2 98%   BMI 29.06 kg/m   Physical Exam Vitals and nursing note reviewed.  Constitutional:      General: She is not in acute distress.    Appearance: She is not ill-appearing.  HENT:     Head: Normocephalic.  Eyes:     Pupils: Pupils are equal, round, and reactive to light.  Cardiovascular:     Rate and Rhythm: Normal rate and regular rhythm.     Pulses: Normal pulses.     Heart sounds: Normal heart sounds. No murmur heard.  No friction rub. No gallop.   Pulmonary:     Effort: Pulmonary effort is normal.     Breath sounds: Normal breath sounds.  Abdominal:     General: Abdomen is flat. Bowel sounds are normal. There is no distension.     Palpations: Abdomen is soft.     Tenderness: There is no abdominal tenderness. There is no guarding or rebound.  Musculoskeletal:     Cervical back: Neck supple.     Comments: Able to move all 4 extremities without difficulty.  Skin:    General: Skin is warm and dry.  Neurological:     General: No focal deficit present.     Mental Status: She is alert.     Comments: Speech is clear, able to follow commands CN III-XII intact Normal strength in upper and lower extremities bilaterally including dorsiflexion and plantar flexion, strong and equal grip strength Sensation grossly intact throughout Moves extremities without ataxia, coordination intact No pronator drift Ambulates without difficulty  Psychiatric:        Mood and Affect: Mood normal.        Behavior: Behavior normal.     ED Results / Procedures / Treatments   Labs (all labs ordered are listed, but only abnormal results are displayed) Labs Reviewed  COMPREHENSIVE METABOLIC  PANEL - Abnormal; Notable for the following components:      Result Value   Glucose, Bld 124 (*)    AST 54 (*)    All other components within normal limits  CBC WITH DIFFERENTIAL/PLATELET    EKG EKG Interpretation  Date/Time:  Wednesday January 24 2020 08:58:08 EDT Ventricular Rate:  67 PR Interval:    QRS Duration: 135 QT Interval:  439 QTC Calculation: 464 R Axis:   -21 Text Interpretation: Sinus arrhythmia LAE, consider biatrial enlargement Left bundle branch block No significant change  since last tracing Confirmed by Theotis Burrow [phone]) on 01/24/2020 9:06:25 AM   Radiology CT Head Wo Contrast  Result Date: 01/24/2020 CLINICAL DATA:  Headache, new or worsening. Additional provided: Headache and elevated blood pressure. EXAM: CT HEAD WITHOUT CONTRAST TECHNIQUE: Contiguous axial images were obtained from the base of the skull through the vertex without intravenous contrast. COMPARISON:  Prior head CT examinations 01/27/2014 and earlier, brain MRI 07/04/2009 FINDINGS: Brain: Stable, mild generalized parenchymal atrophy. Stable, moderate patchy hypoattenuation within the cerebral white matter which is nonspecific, but consistent with chronic small vessel ischemic disease. There is no acute intracranial hemorrhage. No demarcated cortical infarct is identified. No extra-axial fluid collection. No evidence of intracranial mass. No midline shift. Vascular: No hyperdense vessel.  Atherosclerotic calcifications. Skull: Normal. Negative for fracture or focal lesion. Sinuses/Orbits: Visualized orbits show no acute finding. No significant paranasal sinus disease or mastoid effusion at the imaged levels. IMPRESSION: No CT evidence of acute intracranial abnormality. Stable mild generalized parenchymal atrophy and moderate chronic small vessel ischemic disease. Electronically Signed   By: Kellie Simmering DO   On: 01/24/2020 08:03    Procedures Procedures (including critical care time)  Medications  Ordered in ED Medications  acetaminophen (TYLENOL) tablet 650 mg (has no administration in time range)    ED Course  I have reviewed the triage vital signs and the nursing notes.  Pertinent labs & imaging results that were available during my care of the patient were reviewed by me and considered in my medical decision making (see chart for details).  Clinical Course as of Jan 23 914  Wed Jan 24, 2020  0710 Glucose(!): 124 [CA]  0710 AST(!): 54 [CA]    Clinical Course User Index [CA] Suzy Bouchard, PA-C   MDM Rules/Calculators/A&P                         86 year old female presents to the ED due to elevated blood pressure readings.  Patient was recently stopped on amlodipine 5 mg 2 weeks ago and restarted yesterday.  Patient checked her blood pressure at 1 AM this morning which was 200/101.  Patient admits to intermittent headaches for the past week and dizziness which she describes as feeling off balance.  Denies unilateral weakness, speech changes, visual changes, nausea, vomiting, chest pain, and facial droop.  Upon arrival, patient afebrile, not tachycardic or hypoxic.  BP at 186/89.  We will continue to monitor.  Patient in no acute distress and nontoxic-appearing.  Physical exam reassuring.  Normal neurological exam.  Routine labs ordered at triage. Will add CT head to rule out hemorrhage given elevated blood pressure readings and intermittent headache for the past week.  Suspect elevated blood pressure due to change in medication. Tylenol given for headache.   CBC unremarkable no leukocytosis and normal hemoglobin.  CMP reassuring with normal renal function no major electrolyte derangements.  Mild elevation AST of 54.  EKG personally reviewed which demonstrates sinus arrhythmia with left bundle branch block. No change from previous EKG. Head CT personally reviewed which demonstrates: IMPRESSION:  No CT evidence of acute intracranial abnormality.    Stable mild generalized  parenchymal atrophy and moderate chronic  small vessel ischemic disease.   Low suspicion for CVA. No signs of end organ damage on labs. Low suspicion for HTN emergency. Orthostatic vitals obtained which demonstrate a drop in systolic BP when standing; however, BP still hypertensive, low suspicion for orthostatic hypotension. Instructed patient to call PCP  today for further evaluation of blood pressure. Educated patient on taking BP medications the same time daily. Strict ED precautions discussed with patient. Patient states understanding and agrees to plan. Patient discharged home in no acute distress and stable vitals.  Discussed case with Dr. Rex Kras who evaluated patient at bedside and agrees with assessment and plan.  Final Clinical Impression(s) / ED Diagnoses Final diagnoses:  Elevated blood pressure reading  Acute nonintractable headache, unspecified headache type    Rx / DC Orders ED Discharge Orders    None       Karie Kirks 01/24/20 Lucan, Wenda Overland, MD 01/24/20 1105

## 2020-01-24 NOTE — Discharge Instructions (Signed)
As discussed, continue to take your blood pressure medication as prescribed and the same time each day. Please call your PCP today to schedule an appointment for further evaluation. You may take over the counter tylenol as needed for your headache. Your head CT is normal with no acute abnormalities. Return to the ER for new or worsening symptoms.

## 2020-08-02 ENCOUNTER — Other Ambulatory Visit: Payer: Self-pay | Admitting: Internal Medicine

## 2020-08-02 DIAGNOSIS — Z1231 Encounter for screening mammogram for malignant neoplasm of breast: Secondary | ICD-10-CM

## 2020-09-05 ENCOUNTER — Other Ambulatory Visit: Payer: Self-pay | Admitting: Internal Medicine

## 2020-09-06 LAB — LIPID PANEL
Cholesterol: 189 mg/dL (ref <200)
HDL: 58 mg/dL (ref >=50)
LDL Cholesterol (Calc): 109 mg/dL (calc) — ABNORMAL HIGH
Non-HDL Cholesterol (Calc): 131 mg/dL (calc) — ABNORMAL HIGH (ref <130)
Total CHOL/HDL Ratio: 3.3 (calc) (ref <5.0)
Triglycerides: 112 mg/dL (ref <150)

## 2020-09-06 LAB — CBC
HCT: 38 % (ref 35.0–45.0)
Hemoglobin: 12.4 g/dL (ref 11.7–15.5)
MCH: 30.2 pg (ref 27.0–33.0)
MCHC: 32.6 g/dL (ref 32.0–36.0)
MCV: 92.7 fL (ref 80.0–100.0)
MPV: 10.7 fL (ref 7.5–12.5)
Platelets: 242 10*3/uL (ref 140–400)
RBC: 4.1 10*6/uL (ref 3.80–5.10)
RDW: 12.8 % (ref 11.0–15.0)
WBC: 6.8 10*3/uL (ref 3.8–10.8)

## 2020-09-06 LAB — COMPLETE METABOLIC PANEL WITH GFR
AG Ratio: 1.3 (calc) (ref 1.0–2.5)
ALT: 12 U/L (ref 6–29)
AST: 60 U/L — ABNORMAL HIGH (ref 10–35)
Albumin: 3.9 g/dL (ref 3.6–5.1)
Alkaline phosphatase (APISO): 62 U/L (ref 37–153)
BUN: 16 mg/dL (ref 7–25)
CO2: 25 mmol/L (ref 20–32)
Calcium: 9.8 mg/dL (ref 8.6–10.4)
Chloride: 106 mmol/L (ref 98–110)
Creat: 0.81 mg/dL (ref 0.60–0.88)
GFR, Est African American: 74 mL/min/{1.73_m2} (ref >=60)
GFR, Est Non African American: 64 mL/min/{1.73_m2} (ref >=60)
Globulin: 3.1 g/dL (calc) (ref 1.9–3.7)
Glucose, Bld: 119 mg/dL — ABNORMAL HIGH (ref 65–99)
Potassium: 4.7 mmol/L (ref 3.5–5.3)
Sodium: 140 mmol/L (ref 135–146)
Total Bilirubin: 1 mg/dL (ref 0.2–1.2)
Total Protein: 7 g/dL (ref 6.1–8.1)

## 2020-09-06 LAB — URIC ACID: Uric Acid, Serum: 5 mg/dL (ref 2.5–7.0)

## 2020-09-06 LAB — TSH: TSH: 0.68 mIU/L (ref 0.40–4.50)

## 2020-09-16 ENCOUNTER — Inpatient Hospital Stay: Admission: RE | Admit: 2020-09-16 | Payer: Medicare Other | Source: Ambulatory Visit

## 2020-09-16 ENCOUNTER — Other Ambulatory Visit: Payer: Self-pay

## 2020-09-16 ENCOUNTER — Ambulatory Visit
Admission: RE | Admit: 2020-09-16 | Discharge: 2020-09-16 | Disposition: A | Discharge status: Home / Self Care | Payer: Medicare Other | Source: Ambulatory Visit | Attending: Internal Medicine | Admitting: Internal Medicine

## 2020-09-16 DIAGNOSIS — Z1231 Encounter for screening mammogram for malignant neoplasm of breast: Secondary | ICD-10-CM

## 2021-04-14 ENCOUNTER — Other Ambulatory Visit: Payer: Self-pay

## 2021-04-14 ENCOUNTER — Encounter (HOSPITAL_COMMUNITY): Payer: Self-pay

## 2021-04-14 ENCOUNTER — Emergency Department (HOSPITAL_COMMUNITY)
Admission: EM | Admit: 2021-04-14 | Discharge: 2021-04-15 | Disposition: A | Discharge status: Home / Self Care | Payer: Medicare Other | Attending: Emergency Medicine | Admitting: Emergency Medicine

## 2021-04-14 DIAGNOSIS — Z853 Personal history of malignant neoplasm of breast: Secondary | ICD-10-CM | POA: Diagnosis not present

## 2021-04-14 DIAGNOSIS — E119 Type 2 diabetes mellitus without complications: Secondary | ICD-10-CM | POA: Insufficient documentation

## 2021-04-14 DIAGNOSIS — R059 Cough, unspecified: Secondary | ICD-10-CM | POA: Insufficient documentation

## 2021-04-14 DIAGNOSIS — Z79899 Other long term (current) drug therapy: Secondary | ICD-10-CM | POA: Diagnosis not present

## 2021-04-14 DIAGNOSIS — R040 Epistaxis: Secondary | ICD-10-CM

## 2021-04-14 DIAGNOSIS — R042 Hemoptysis: Secondary | ICD-10-CM | POA: Insufficient documentation

## 2021-04-14 DIAGNOSIS — I10 Essential (primary) hypertension: Secondary | ICD-10-CM | POA: Diagnosis not present

## 2021-04-14 LAB — CBC WITH DIFFERENTIAL/PLATELET
Abs Immature Granulocytes: 0.01 10*3/uL (ref 0.00–0.07)
Basophils Absolute: 0 10*3/uL (ref 0.0–0.1)
Basophils Relative: 1 %
Eosinophils Absolute: 0.1 10*3/uL (ref 0.0–0.5)
Eosinophils Relative: 2 %
HCT: 40 % (ref 36.0–46.0)
Hemoglobin: 12.7 g/dL (ref 12.0–15.0)
Immature Granulocytes: 0 %
Lymphocytes Relative: 34 %
Lymphs Abs: 2.2 10*3/uL (ref 0.7–4.0)
MCH: 29.9 pg (ref 26.0–34.0)
MCHC: 31.8 g/dL (ref 30.0–36.0)
MCV: 94.1 fL (ref 80.0–100.0)
Monocytes Absolute: 0.8 10*3/uL (ref 0.1–1.0)
Monocytes Relative: 12 %
Neutro Abs: 3.3 10*3/uL (ref 1.7–7.7)
Neutrophils Relative %: 51 %
Platelets: 256 10*3/uL (ref 150–400)
RBC: 4.25 MIL/uL (ref 3.87–5.11)
RDW: 15.1 % (ref 11.5–15.5)
WBC: 6.4 10*3/uL (ref 4.0–10.5)
nRBC: 0 % (ref 0.0–0.2)

## 2021-04-14 LAB — COMPREHENSIVE METABOLIC PANEL
ALT: 18 U/L (ref 0–44)
AST: 54 U/L — ABNORMAL HIGH (ref 15–41)
Albumin: 4 g/dL (ref 3.5–5.0)
Alkaline Phosphatase: 56 U/L (ref 38–126)
Anion gap: 6 (ref 5–15)
BUN: 16 mg/dL (ref 8–23)
CO2: 27 mmol/L (ref 22–32)
Calcium: 9.7 mg/dL (ref 8.9–10.3)
Chloride: 103 mmol/L (ref 98–111)
Creatinine, Ser: 0.81 mg/dL (ref 0.44–1.00)
GFR, Estimated: >60 mL/min (ref >60)
Glucose, Bld: 137 mg/dL — ABNORMAL HIGH (ref 70–99)
Potassium: 4.5 mmol/L (ref 3.5–5.1)
Sodium: 136 mmol/L (ref 135–145)
Total Bilirubin: 1 mg/dL (ref 0.3–1.2)
Total Protein: 7.9 g/dL (ref 6.5–8.1)

## 2021-04-14 NOTE — ED Triage Notes (Signed)
Pt states her BP has been running high for past several days. Pt states she went to see her PCP 10/5 and it was elevated, pt states her PCP did not give her any new meds or change her meds. Pt states she has been taking her BP meds as prescribed. Pt denies headache, pt has no other complaints.

## 2021-04-14 NOTE — ED Notes (Signed)
ED Provider at bedside. 

## 2021-04-14 NOTE — ED Provider Notes (Signed)
Emergency Medicine Provider Triage Evaluation Note  [name] , a 87 y.o. female  was evaluated in triage.  Pt complains of hypertension.  Patient reports that her blood pressure has been elevated for approximately 1 week.  Patient has a history of hypertension however has been taking medications as prescribed.  Review of Systems  Positive: Hypertension Negative: Headache, visual disturbance, numbness, weakness, chest pain, shortness of breath, abdominal pain, nausea, vomiting  Physical Exam  BP (!) 206/86   Pulse 89   Temp 97.9 F (36.6 C) (Oral)   Resp 18   SpO2 99%  Gen:   Awake, no distress   Resp:  Normal effort  MSK:   Moves extremities without difficulty  Other:  No dysarthria or facial asymmetry  Medical Decision Making  Medically screening exam initiated at 9:11 PM.  Appropriate orders placed.  [name] was informed that the remainder of the evaluation will be completed by another provider, this initial triage assessment does not replace that evaluation, and the importance of remaining in the ED until their evaluation is complete.     [name] 04/14/21 2112    Valarie Merino, MD 04/14/21 2330

## 2021-04-15 LAB — URINALYSIS, ROUTINE W REFLEX MICROSCOPIC
Bilirubin Urine: NEGATIVE
Glucose, UA: NEGATIVE mg/dL
Hgb urine dipstick: NEGATIVE
Ketones, ur: 5 mg/dL — AB
Nitrite: NEGATIVE
Protein, ur: NEGATIVE mg/dL
Specific Gravity, Urine: 1.015 (ref 1.005–1.030)
pH: 7 (ref 5.0–8.0)

## 2021-04-15 NOTE — ED Provider Notes (Signed)
[facility] DEPT Provider Note   CSN: [id number] Arrival date & time: 04/14/21  2053     History Chief Complaint  Patient presents with   Hypertension    [name] is a 87 y.o. female.  HPI She presents for evaluation of concern for nosebleeding with cough, and bloody sputum.  These symptoms both occurred last evening.  She has had an ongoing cough for several days, and recently her doctor started her on a combination medication containing antihistamine, decongestant, and acetaminophen.  She has been using these for the last several days.  She denies headache, persistent nasal discharge, facial pain, chest pain, weakness or dizziness.  She has had previous episodes of high blood pressure and is currently treated with Norvasc and l valsartan.  There are no other known active modifying factors.    Past Medical History:  Diagnosis Date   Anemia    Breast cancer ([place]) 2002   Right   Cataract    Diabetes mellitus without complication ([facility])    Glaucoma    High cholesterol    Hypertension    Personal history of chemotherapy    Personal history of radiation therapy     Patient Active Problem List   Diagnosis Date Noted   Lower abdominal pain 10/21/2016   Chronic hip pain 10/21/2016   Syncope 01/27/2014    Past Surgical History:  Procedure Laterality Date   ABDOMINAL HYSTERECTOMY     APPENDECTOMY     BREAST LUMPECTOMY Right    EYE SURGERY       OB History   No obstetric history on file.     Family History  Problem Relation Age of Onset   Hyperlipidemia Sister    Diabetes Brother    Hyperlipidemia Brother    Diabetes Maternal Grandmother    Stroke Maternal Grandfather    Diabetes Brother    Breast cancer Neg Hx     Social History   Tobacco Use   Smoking status: Never   Smokeless tobacco: Never  Substance Use Topics   Alcohol use: No   Drug use: No    Home Medications Prior to Admission medications   Medication  Sig Start Date End Date Taking? Authorizing Provider  acetaminophen (TYLENOL) 500 MG tablet Take 1 tablet (500 mg total) by mouth every 6 (six) hours as needed. 08/25/18   Loura Halt A, NP  amLODipine (NORVASC) 5 MG tablet Take 5 mg by mouth daily.    [provider]  aspirin EC 81 MG tablet Take 81 mg by mouth at bedtime. Reported on 09/27/2015    [provider]  diclofenac sodium (VOLTAREN) 1 % GEL Apply 4 g topically 4 (four) times daily. 08/25/18   Loura Halt A, NP  naproxen (NAPROSYN) 375 MG tablet Take 1 tablet (375 mg total) by mouth 2 (two) times daily. 01/17/20   Hayden Rasmussen, MD  valsartan (DIOVAN) 160 MG tablet Take 1 tablet (160 mg total) by mouth daily. 01/28/14   Charolette Forward, MD    Allergies    Patient has no known allergies.  Review of Systems   Review of Systems  All other systems reviewed and are negative.  Physical Exam Updated Vital Signs BP (!) 198/88 (BP Location: Left Arm)   Pulse 83   Temp 97.7 F (36.5 C) (Oral)   Resp 12   SpO2 98%   Physical Exam Vitals and nursing note reviewed.  Constitutional:      General: She is  not in acute distress.    Appearance: She is well-developed. She is not ill-appearing, toxic-appearing or diaphoretic.  HENT:     Head: Normocephalic and atraumatic.     Right Ear: External ear normal.     Left Ear: External ear normal.     Nose: No congestion or rhinorrhea.  Eyes:     Conjunctiva/sclera: Conjunctivae normal.     Pupils: Pupils are equal, round, and reactive to light.  Neck:     Trachea: Phonation normal.  Cardiovascular:     Rate and Rhythm: Normal rate and regular rhythm.     Heart sounds: Normal heart sounds.  Pulmonary:     Effort: Pulmonary effort is normal.     Breath sounds: Normal breath sounds.  Abdominal:     General: There is no distension.     Palpations: Abdomen is soft.     Tenderness: There is no abdominal tenderness.  Musculoskeletal:        General: Normal range of  motion.     Cervical back: Normal range of motion and neck supple.  Skin:    General: Skin is warm and dry.  Neurological:     Mental Status: She is alert and oriented to person, place, and time.     Cranial Nerves: No cranial nerve deficit.     Sensory: No sensory deficit.     Motor: No abnormal muscle tone.     Coordination: Coordination normal.     Comments: No dysarthria, aphasia or nystagmus  Psychiatric:        Mood and Affect: Mood normal.        Behavior: Behavior normal.        Thought Content: Thought content normal.        Judgment: Judgment normal.    ED Results / Procedures / Treatments   Labs (all labs ordered are listed, but only abnormal results are displayed) Labs Reviewed  COMPREHENSIVE METABOLIC PANEL - Abnormal; Notable for the following components:      Result Value   Glucose, Bld 137 (*)    AST 54 (*)    All other components within normal limits  URINALYSIS, ROUTINE W REFLEX MICROSCOPIC - Abnormal; Notable for the following components:   Ketones, ur 5 (*)    Leukocytes,Ua MODERATE (*)    Bacteria, UA RARE (*)    All other components within normal limits  CBC WITH DIFFERENTIAL/PLATELET    EKG EKG Interpretation  Date/Time:  Monday April 14 2021 21:09:15 EDT Ventricular Rate:  85 PR Interval:  152 QRS Duration: 135 QT Interval:  413 QTC Calculation: 492 R Axis:   -31 Text Interpretation: Sinus rhythm Probable left atrial enlargement Left bundle branch block No significant change since last tracing Confirmed by Isla Pence [phone]) on 04/15/2021 7:06:47 AM  Radiology No results found.  Procedures Procedures   Medications Ordered in ED Medications - No data to display  ED Course  I have reviewed the triage vital signs and the nursing notes.  Pertinent labs & imaging results that were available during my care of the patient were reviewed by me and considered in my medical decision making (see chart for details).    MDM  Rules/Calculators/A&P                            Patient Vitals for the past 24 hrs:  BP Temp Temp src Pulse Resp SpO2  04/15/21 0931 (!) 198/88 97.7 F (36.5  C) Oral 83 12 98 %  04/15/21 0731 (!) 190/88 -- -- 84 18 98 %  04/15/21 0327 (!) 171/101 -- -- 83 16 99 %  04/14/21 2101 (!) 206/86 97.9 F (36.6 C) Oral 89 18 99 %    9:32 AM Reevaluation with update and discussion. After initial assessment and treatment, an updated evaluation reveals she is comfortable has no further complaints.  Findings discussed and questions answered. Daleen Bo   Medical Decision Making:  This patient is presenting for evaluation of cold symptoms and high blood pressure, which does require a range of treatment options, and is a complaint that involves a moderate risk of morbidity and mortality. The differential diagnoses include hypertensive urgency, bleeding for hypertension,. I decided to review old records, and in summary elderly female with longstanding hypertension, presenting after being started on medication for the cold..  I did not require additional historical information from anyone.  Clinical Laboratory Tests Ordered, included CBC and Metabolic panel. Review indicates normal findings except glucose high, AST high, nonspecific urinalysis abnormalities.   Critical Interventions-clinical evaluation, laboratory testing, discussion with patient  After These Interventions, the Patient was reevaluated and was found stable for discharge.  Patient with mild hypertension.  No signs of hypertensive urgency.  Incidental abnormal urine, ordered at triage assessment, without urinary tract symptoms.  Doubt UTI.  No indication for further ED treatment, evaluation or hospitalization.  Patient recommended to stop taking the combination medicine which contains phenylephrine, likely driving up her blood pressure.  She is encouraged to follow-up with her PCP for problems.  CRITICAL CARE-no Performed by: Daleen Bo  Nursing Notes Reviewed/ Care Coordinated Applicable Imaging Reviewed Interpretation of Laboratory Data incorporated into ED treatment  The patient appears reasonably screened and/or stabilized for discharge and I doubt any other medical condition or other [facility] requiring further screening, evaluation, or treatment in the ED at this time prior to discharge.  Plan: Home Medications-continue usual except the combination cold medicine; Home Treatments-rest, fluids; return here if the recommended treatment, does not improve the symptoms; Recommended follow up-PCP, as needed     Final Clinical Impression(s) / ED Diagnoses Final diagnoses:  Bleeding from the nose  Hypertension, unspecified type    Rx / DC Orders ED Discharge Orders     None        Daleen Bo, MD 04/15/21 [phone]

## 2021-04-15 NOTE — Discharge Instructions (Addendum)
The testing today shows that you have normal blood counts.  You have not lost too much blood.  I agree with Dr. Jeanie Cooks that you have either cold or allergies causing your cough, which likely led to the nosebleeding.  This is not a serious problem.  The medicine I gave you for the symptoms can make your blood pressure go little bit higher.  So if your symptoms are mild I would not use that medicine, at this time.  The type of coughing and bleeding that you had is not serious and will likely improve after a few days.  There is no sign of serious blood pressure problems or concern for stroke at this time.  Make sure you are getting plenty of rest, drink a lot of fluids and take all of your medicines as prescribed.

## 2021-04-15 NOTE — ED Notes (Signed)
ED Provider at bedside. 

## 2021-07-20 DIAGNOSIS — Z139 Encounter for screening, unspecified: Secondary | ICD-10-CM

## 2021-09-02 ENCOUNTER — Other Ambulatory Visit: Payer: Self-pay | Admitting: Internal Medicine

## 2021-09-02 DIAGNOSIS — Z1231 Encounter for screening mammogram for malignant neoplasm of breast: Secondary | ICD-10-CM

## 2021-09-03 DIAGNOSIS — Z139 Encounter for screening, unspecified: Secondary | ICD-10-CM

## 2021-09-10 DIAGNOSIS — Z139 Encounter for screening, unspecified: Secondary | ICD-10-CM

## 2021-09-13 LAB — GLUCOSE, POCT (MANUAL RESULT ENTRY): POC Glucose: 148 mg/dl — AB (ref 70–99)

## 2021-09-13 NOTE — Congregational Nurse Program (Signed)
?  Dept: [phone] ? ? ?Congregational Nurse Program Note ? ?Date of Encounter: 09/10/2021 ? ?Past Medical History: ?Past Medical History:  ?Diagnosis Date  ? Anemia   ? Breast cancer ([place]) 2002  ? Right  ? Cataract   ? Diabetes mellitus without complication ([place])   ? Glaucoma   ? High cholesterol   ? Hypertension   ? Personal history of chemotherapy   ? Personal history of radiation therapy   ? ? ?Encounter Details: ? CNP Questionnaire - 09/13/21 1426   ? ?  ? Questionnaire  ? Do you give verbal consent to treat you today? Yes   ? Location Patient Served  Not Applicable   [facility]  ? Visit Setting Church or Organization   ? Patient Status Unknown   ? Insurance Unknown   ? Insurance Referral N/A   ? Medication N/A   ? Medical Provider Yes   ? Screening Referrals N/A   ? Medical Referral N/A   ? Medical Appointment Made N/A   ? Food N/A   ? Transportation N/A   ? Housing/Utilities N/A   ? Interpersonal Safety N/A   ? Intervention Blood pressure;Blood glucose;Educate;Spiritual Care   ? ED Visit Averted N/A   ? Life-Saving Intervention Made N/A   ? ?  ?  ? ?  ? ? ?Today's Vitals  ? 09/10/21 1324  ?BP: (!) 158/68  ?Pulse: 89  ?Resp: 18  ?Temp: 97.7 ?F (36.5 ?C)  ?SpO2: 100%  ? ?There is no height or weight on file to calculate BMI.  ? ?Patient seen in clinic. Patient vital signs and blood glucose obtained.Patient participated in the mobile Kitchen with the [place] and ate prior to the collection of blood glucose. Patient had some concern about swelling to her lower legs. Noted patient had compression sock at ankle level. Discussed reducing the patients salt intake. Encouraged patient about proper repositioning, such as elevating the legs. Discussed wearing compression socks knee level. Patient verbalized understanding of education.  ? ? ? ? ?

## 2021-09-15 LAB — GLUCOSE, POCT (MANUAL RESULT ENTRY): POC Glucose: 124 mg/dl — AB (ref 70–99)

## 2021-09-15 NOTE — Congregational Nurse Program (Signed)
?  Dept: [phone] ? ? ?Congregational Nurse Program Note ? ?Date of Encounter: 09/03/2021 ? ?Past Medical History: ?Past Medical History:  ?Diagnosis Date  ? Anemia   ? Breast cancer ([place]) 2002  ? Right  ? Cataract   ? Diabetes mellitus without complication ([place])   ? Glaucoma   ? High cholesterol   ? Hypertension   ? Personal history of chemotherapy   ? Personal history of radiation therapy   ? ? ?Encounter Details: ? CNP Questionnaire - 09/03/21 1330   ? ?  ? Questionnaire  ? Do you give verbal consent to treat you today? Yes   ? Location Patient Served  Not Applicable   [facility]  ? Visit Setting [facility] or Organization   ? Patient Status Unknown   ? Insurance Unknown   ? Insurance Referral N/A   ? Medication N/A   ? Medical Provider Yes   ? Screening Referrals N/A   ? Medical Referral N/A   ? Medical Appointment Made N/A   ? Food N/A   ? Transportation N/A   ? Housing/Utilities N/A   ? Interpersonal Safety N/A   ? Intervention Blood pressure;Blood glucose;Spiritual Care;Support;Educate   ? ED Visit Averted N/A   ? Life-Saving Intervention Made N/A   ? ?  ?  ? ?  ? ? ?Today's Vitals  ? 09/03/21 1330  ?BP: (!) 158/68  ?Pulse: 87  ?Resp: 18  ?Temp: 97.7 ?F (36.5 ?C)  ?SpO2: 99%  ?PainSc: 0-No pain  ? ?There is no height or weight on file to calculate BMI.  ? ?Patient came to clinic to monitor vital signs and blood glucose. Patient blood pressure was elevated and patient was given education on hypertension. Discussed with patient risk factors of hypertension and helped her identify her personal risk factors. Encouraged patient to monitor blood pressure and educated patient what is considered high or low blood pressure. Patient given written education about hypertension. Patient verbalized understanding of hypertension education.  ? ?Andrez Grime, BSN, RN, CRRN,CMSRN  ? ?

## 2021-09-16 LAB — GLUCOSE, POCT (MANUAL RESULT ENTRY): POC Glucose: 144 mg/dl — AB (ref 70–99)

## 2021-09-17 ENCOUNTER — Ambulatory Visit
Admission: RE | Admit: 2021-09-17 | Discharge: 2021-09-17 | Disposition: A | Discharge status: Home / Self Care | Payer: Medicare Other | Source: Ambulatory Visit | Attending: Internal Medicine | Admitting: Internal Medicine

## 2021-09-17 DIAGNOSIS — Z1231 Encounter for screening mammogram for malignant neoplasm of breast: Secondary | ICD-10-CM

## 2021-09-24 DIAGNOSIS — Z139 Encounter for screening, unspecified: Secondary | ICD-10-CM

## 2021-10-01 LAB — GLUCOSE, POCT (MANUAL RESULT ENTRY): POC Glucose: 128 mg/dl — AB (ref 70–99)

## 2021-10-01 NOTE — Congregational Nurse Program (Signed)
?  Dept: [phone] ? ? ?Congregational Nurse Program Note ? ?Date of Encounter: 09/24/2021 ? ?Past Medical History: ?Past Medical History:  ?Diagnosis Date  ? Anemia   ? Breast cancer ([place]) 2002  ? Right  ? Cataract   ? Diabetes mellitus without complication ([place])   ? Glaucoma   ? High cholesterol   ? Hypertension   ? Personal history of chemotherapy   ? Personal history of radiation therapy   ? ? ?Encounter Details: ? CNP Questionnaire - 09/24/21 1349   ? ?  ? Questionnaire  ? Do you give verbal consent to treat you today? Yes   ? Location Patient Served  Not Applicable   [facility]  ? Visit Setting [employer] or Organization   ? Patient Status Unknown   ? Insurance Unknown   ? Insurance Referral N/A   ? Medication N/A   ? Medical Provider Yes   ? Screening Referrals N/A   ? Medical Referral N/A   ? Medical Appointment Made N/A   ? Food N/A   ? Transportation N/A   ? Housing/Utilities N/A   ? Interpersonal Safety N/A   ? Intervention Blood pressure;Blood glucose;Spiritual Care;Support;Educate   ? ED Visit Averted N/A   ? Life-Saving Intervention Made N/A   ? ?  ?  ? ?  ? ? ?Today's Vitals  ? 10/01/21 1347  ?BP: (!) 154/78  ?Pulse: 76  ?Resp: 16  ?Temp: (!) 97.3 ?F (36.3 ?C)  ?SpO2: 99%  ? ?There is no height or weight on file to calculate BMI.  ? ?Patient was given education concerning hypertensive. Patient blood glucose collected. Patient brought glucometer from home. Patient stated she did not take glucose at home related to not understanding how to properly use glucometer. Patient batter was dead. Encouraged patient to get a new battery and come back for further instruction. Patient able to verbalize understanding of education given.  ? ?Andrez Grime, BSN, RN, CRRN,CMSRN  ? ?

## 2021-10-01 NOTE — Congregational Nurse Program (Signed)
?  Dept: [phone] ? ? ?Congregational Nurse Program Note ? ?Date of Encounter: 10/01/2021 ? ?Past Medical History: ?Past Medical History:  ?Diagnosis Date  ? Anemia   ? Breast cancer ([place]) 2002  ? Right  ? Cataract   ? Diabetes mellitus without complication ([place])   ? Glaucoma   ? High cholesterol   ? Hypertension   ? Personal history of chemotherapy   ? Personal history of radiation therapy   ? ? ?Encounter Details: ? CNP Questionnaire - 10/01/21 1400   ? ?  ? Questionnaire  ? Do you give verbal consent to treat you today? Yes   ? Location Patient Served  Not Applicable   [facility]  ? Visit Setting Church or Organization   ? Patient Status Unknown   ? Insurance Unknown   ? Insurance Referral N/A   ? Medication N/A   ? Medical Provider Yes   ? Screening Referrals N/A   ? Medical Referral N/A   ? Medical Appointment Made N/A   ? Food N/A   ? Transportation N/A   ? Housing/Utilities N/A   ? Interpersonal Safety N/A   ? Intervention Blood pressure;Spiritual Care;Support;Educate   ? ED Visit Averted N/A   ? Life-Saving Intervention Made N/A   ? ?  ?  ? ?  ? ? ?Today's Vitals  ? 10/01/21 1410  ?BP: 138/68  ?Pulse: 74  ?Resp: 16  ?Temp: 97.6 ?F (36.4 ?C)  ?SpO2: 98%  ?PainSc: 0-No pain  ? ?There is no height or weight on file to calculate BMI.  ? ?Patient vital signs collected. Patient blood pressure improved from the prior week. Patient stated she did take her medication this morning. Patient also drought her personal glucometer to office today. Watched a video by manufacture ( [employer] ver[employer] Flex) concerning demonstration of glucometer. Patient stated verbally understanding of glucometer instructions.Patient demonstrated proper use of glucometer. ? ?Andrez Grime, BSN, RN, CRRN,CMSRN  ?

## 2021-10-29 DIAGNOSIS — Z139 Encounter for screening, unspecified: Secondary | ICD-10-CM

## 2021-10-29 LAB — GLUCOSE, POCT (MANUAL RESULT ENTRY): POC Glucose: 119 mg/dl — AB (ref 70–99)

## 2021-11-12 NOTE — Congregational Nurse Program (Signed)
    Dept: [phone]   Congregational Nurse Program Note  Date of Encounter: 10/22/2021  Past Medical History: Past Medical History:  Diagnosis Date   Anemia    Breast cancer ([place]) 2002   Right   Cataract    Diabetes mellitus without complication (HCC)    Glaucoma    High cholesterol    Hypertension    Personal history of chemotherapy    Personal history of radiation therapy     Encounter Details:  CNP Questionnaire - 10/22/21 1300       Questionnaire   Do you give verbal consent to treat you today? Yes    Location Patient Served  Not Applicable   [place] [place] or Organization    Patient Status Unknown    Insurance Unknown    Insurance Referral N/A    Medication N/A    Medical Provider Yes    Screening Referrals N/A    Medical Referral N/A    Medical Appointment Made N/A    Food N/A    Transportation N/A    Housing/Utilities N/A    Interpersonal Safety N/A    Intervention Blood pressure;Spiritual Care;Support;Educate    ED Visit Averted N/A    Life-Saving Intervention Made N/A             Today's Vitals   10/22/21 1300  BP: 136/72  Pulse: 73  Resp: 18  Temp: 97.6 F (36.4 C)  TempSrc: Oral  SpO2: 97%  Weight: 136 lb (61.7 kg)   Body mass index is 26.56 kg/m.   Discusses with patient about wearing compressions. Patient has compressions socks at the ankle. We discussed the patient getting compression to cover the lower extremity. Discussed the benefits of compression socks.   Andrez Grime, BSN, RN, CRRN,CMSRN

## 2021-11-26 DIAGNOSIS — Z139 Encounter for screening, unspecified: Secondary | ICD-10-CM

## 2021-11-26 LAB — GLUCOSE, POCT (MANUAL RESULT ENTRY): POC Glucose: 118 mg/dl — AB (ref 70–99)

## 2021-11-26 NOTE — Congregational Nurse Program (Signed)
  Dept: [phone]   Congregational Nurse Program Note  Date of Encounter: 11/19/2021  Past Medical History: Past Medical History:  Diagnosis Date   Anemia    Breast cancer ([place]) 2002   Right   Cataract    Diabetes mellitus without complication (HCC)    Glaucoma    High cholesterol    Hypertension    Personal history of chemotherapy    Personal history of radiation therapy     Encounter Details:  CNP Questionnaire - 11/19/21 1305       Questionnaire   Do you give verbal consent to treat you today? Yes    Location Patient Served  Not Applicable   [place] [place] or Organization    Patient Status Unknown    Insurance Unknown    Insurance Referral N/A    Medication N/A    Medical Provider Yes    Screening Referrals N/A    Medical Referral N/A    Medical Appointment Made N/A    Food N/A    Transportation N/A    Housing/Utilities N/A    Interpersonal Safety N/A    Intervention Blood pressure;Spiritual Care;Support;Educate;Counsel    ED Visit Averted N/A    Life-Saving Intervention Made N/A             Today's Vitals   11/19/21 1305  BP: (!) 144/68  Pulse: 92  Resp: 18  Temp: 97.6 F (36.4 C)  TempSrc: Temporal  SpO2: 99%  Weight: 136 lb (61.7 kg)   Body mass index is 26.56 kg/m.   Patient brought glucometer from home and live instruction and demonstration given. Patient verbalized understanding but suggest she believe she needs continued education and will continue to bring glucometer until she feels more secure. Care plan to continue glucose monitoring every week until education completed. Dicussed with patient increasing socializing. Patient encouraged to attend  her class reunion during the mothers day weekend. Patient states her pain is decreased today and she feels good.   Andrez Grime, BSN, RN, CRRN,CMSRN

## 2021-12-08 NOTE — Congregational Nurse Program (Signed)
  Dept: [phone]   Congregational Nurse Program Note  Date of Encounter: 11/26/2021  Past Medical History: Past Medical History:  Diagnosis Date   Anemia    Breast cancer ([place]) 2002   Right   Cataract    Diabetes mellitus without complication (HCC)    Glaucoma    High cholesterol    Hypertension    Personal history of chemotherapy    Personal history of radiation therapy     Encounter Details:  CNP Questionnaire - 11/26/21 1337       Questionnaire   Do you give verbal consent to treat you today? Yes    Location Patient Served  Not Applicable   [place] [place] or Organization    Patient Status Unknown    Insurance Unknown    Insurance Referral N/A    Medication N/A    Medical Provider Yes    Screening Referrals N/A    Medical Referral N/A    Medical Appointment Made N/A    Food N/A    Transportation N/A    Housing/Utilities N/A    Interpersonal Safety N/A    Intervention Blood pressure;Spiritual Care;Support;Educate;Counsel;Blood glucose    ED Visit Averted N/A    Life-Saving Intervention Made N/A             Today's Vitals   11/26/21 1337  BP: 113/69  Pulse: 73  Resp: 18  Temp: 97.8 F (36.6 C)  TempSrc: Temporal  SpO2: 98%  Weight: 136 lb (61.7 kg)   Body mass index is 26.56 kg/m.  Patient vitals,weight, and blood glucose collected. Patient managing weight, no weight increase. Patient is participating in a weight loss challenge and states she is excited about managing her blood glucose. She states she is working on eating foods with less sodium to help reduce edema. Care plan provided for socialization as patient lives alone. Patient is seeking to increase social engagement with others that align with her interest and capabilities. Spiritual support given as well.   Andrez Grime, BSN, RN, CRRN,CMSRN

## 2021-12-08 NOTE — Congregational Nurse Program (Signed)
  Dept: [phone]   Congregational Nurse Program Note  Date of Encounter: 12/08/2021  Past Medical History: Past Medical History:  Diagnosis Date   Anemia    Breast cancer ([place]) 2002   Right   Cataract    Diabetes mellitus without complication (HCC)    Glaucoma    High cholesterol    Hypertension    Personal history of chemotherapy    Personal history of radiation therapy     Encounter Details:  CNP Questionnaire - 12/08/21 1333       Questionnaire   Do you give verbal consent to treat you today? Yes    Location Patient Served  Not Applicable   [place] [place] or Organization    Patient Status Unknown    Insurance Unknown    Insurance Referral N/A    Medication N/A    Medical Provider Yes    Screening Referrals N/A    Medical Referral N/A    Medical Appointment Made N/A    Food N/A    Transportation N/A    Housing/Utilities N/A    Interpersonal Safety N/A    Intervention Spiritual Care;Support;Counsel    ED Visit Averted N/A    Life-Saving Intervention Made N/A            Follow up with patient concerning patient edema to bilateral lower extremities. Patient states with compression socking there is improvement. Spiritual care and support given. Will follow up with patient on Wednesday.   Andrez Grime, BSN, RN, CRRN,CMSRN

## 2021-12-12 NOTE — Congregational Nurse Program (Signed)
  Dept: [phone]   Congregational Nurse Program Note  Date of Encounter: 12/10/2021  Past Medical History: Past Medical History:  Diagnosis Date   Anemia    Breast cancer ([place]) 2002   Right   Cataract    Diabetes mellitus without complication (HCC)    Glaucoma    High cholesterol    Hypertension    Personal history of chemotherapy    Personal history of radiation therapy     Encounter Details:  CNP Questionnaire - 12/10/21 1330       Questionnaire   Do you give verbal consent to treat you today? Yes    Location Patient Served  Not Applicable   [place] [place] or Organization    Patient Status Unknown    Insurance Unknown    Insurance Referral N/A    Medication N/A    Medical Provider Yes    Screening Referrals N/A    Medical Referral N/A    Medical Appointment Made N/A    Food N/A    Transportation N/A    Housing/Utilities N/A    Interpersonal Safety N/A    Intervention Blood pressure;Spiritual Care;Support;Educate;Counsel;Blood glucose    ED Visit Averted N/A    Life-Saving Intervention Made N/A             Today's Vitals   12/10/21 1330  BP: 126/76  Pulse: 74  Resp: 18  Temp: (!) 97.4 F (36.3 C)  TempSrc: Temporal  SpO2: 100%  Weight: 139 lb (63 kg)   Body mass index is 27.15 kg/m.  Patient has a slight increase in weight today. Information given on weight management. Patient brought in her personal glucometer and education given on how to use glucometer. Patient was able to check her blood glucose with the assistance of nurse. Blood glucose result were 87. Patient stated she has been practicing healthy eating education given at prior appointments with congregational nurse. Patient states she is now using Ms. Dash without salt seasonings and herbs. Patient has also been getting fresh vegetation from the community garden at [place]. Patient is also doings chair exercises given by nurse at  home several times a week. Patient will follow up next week with congregational nurse.   Andrez Grime, BSN, RN, CRRN,CMSRN

## 2021-12-15 NOTE — Congregational Nurse Program (Signed)
  Dept: [phone]   Congregational Nurse Program Note  Date of Encounter: 10/29/2021  Past Medical History: Past Medical History:  Diagnosis Date   Anemia    Breast cancer ([place]) 2002   Right   Cataract    Diabetes mellitus without complication (HCC)    Glaucoma    High cholesterol    Hypertension    Personal history of chemotherapy    Personal history of radiation therapy     Encounter Details:   Today's Vitals   10/29/21 1400  BP: 140/62  Pulse: 68  Resp: 16  Temp: (!) 97.4 F (36.3 C)  TempSrc: Temporal  SpO2: 98%  Weight: 138 lb (62.6 kg)   Body mass index is 26.95 kg/m.  CCNP Questionnaire completed and in flow sheet. Patient discussed with nurse how the person assisting her with cleaning has been sick and she is needing assistance in home. Discussion about home health or home health aid to assist with some activities of daily living. Patient decline at this time. Patient state she will reach out to some agencies. Nurse discussed with patient safe ways to perform activities of daily living.   Andrez Grime, BSN, RN, CRRN,CMSRN

## 2022-01-12 NOTE — Congregational Nurse Program (Signed)
  Dept: [phone]   Congregational Nurse Program Note  Date of Encounter: 01/12/2022  Past Medical History: Past Medical History:  Diagnosis Date   Anemia    Breast cancer ([place]) 2002   Right   Cataract    Diabetes mellitus without complication (HCC)    Glaucoma    High cholesterol    Hypertension    Personal history of chemotherapy    Personal history of radiation therapy     Encounter Details:  CNP Questionnaire - 01/12/22 1352       Questionnaire   Do you give verbal consent to treat you today? Yes    Location Patient Served  Not Applicable   [place] [place] or Organization    Patient Status Unknown    Insurance Unknown    Insurance Referral N/A    Medication N/A    Medical Provider Yes    Screening Referrals N/A    Medical Referral N/A    Medical Appointment Made N/A    Food N/A    Transportation N/A    Housing/Utilities N/A    Interpersonal Safety N/A    Intervention Spiritual Care;Support    ED Visit Averted N/A    Life-Saving Intervention Made N/A            Called patient to check in on patients blood glucose and health issues. Follow up. Spirtual care given.

## 2022-01-13 NOTE — Congregational Nurse Program (Signed)
  Dept: [phone]   Congregational Nurse Program Note  Date of Encounter: 11/12/2021  Past Medical History: Past Medical History:  Diagnosis Date   Anemia    Breast cancer ([place]) 2002   Right   Cataract    Diabetes mellitus without complication (HCC)    Glaucoma    High cholesterol    Hypertension    Personal history of chemotherapy    Personal history of radiation therapy     Encounter Details:  CCNP Questionnaire completed. Patient is continued to work on blood glucose levels and how to self check glucose. Patient brought in her own glucometer. Patient blood glucose was 157 on her glucometer. Patient was not fasting and had recently consumed some fruit juice. Patient stated she has snacked more and believes that also has elevated her blood glucose. Patient has made a personal goal to limit gram crackers to see if it will impact her blood glucose readings as well.   Andrez Grime, BSN, RN, CRRN,CMSRN

## 2022-01-21 NOTE — Congregational Nurse Program (Signed)
  Dept: [phone]   Congregational Nurse Program Note  Date of Encounter: 12/17/2021  Past Medical History: Past Medical History:  Diagnosis Date   Anemia    Breast cancer ([place]) 2002   Right   Cataract    Diabetes mellitus without complication (HCC)    Glaucoma    High cholesterol    Hypertension    Personal history of chemotherapy    Personal history of radiation therapy     Encounter Details:  CCNP Questionnaire completed and is in flowsheet. Patient expressed she recently think she has split her tooth and is going to the doctor office. Also the patient talked about an increase with pain. Nurse discussed alternative techniques for pain management such as breathing techniques and guided imagery. Discussed with patient aboout using assistive devices to assist with activities of daily living

## 2022-03-11 DIAGNOSIS — Z139 Encounter for screening, unspecified: Secondary | ICD-10-CM

## 2022-03-11 LAB — GLUCOSE, POCT (MANUAL RESULT ENTRY): POC Glucose: 95 mg/dl (ref 70–99)

## 2022-04-14 NOTE — Congregational Nurse Program (Signed)
  Dept: [phone]   Congregational Nurse Program Note  Date of Encounter: 12/31/2021  Past Medical History: Past Medical History:  Diagnosis Date   Anemia    Breast cancer ([place]) 2002   Right   Cataract    Diabetes mellitus without complication (HCC)    Glaucoma    High cholesterol    Hypertension    Personal history of chemotherapy    Personal history of radiation therapy     Encounter Details:  CCNP Questionnaire completed. Patient expressed a decrease in fine motor skills. Discussed with patient the impact of decrease motor skills to activity of daily living. Patient expressed she was having difficulty with putting on socks and buttoning shirts. Patient goal is to successfully use assistive devices. Will continue with patient until patient goals met.

## 2022-04-16 NOTE — Congregational Nurse Program (Signed)
  Dept: [phone]   Congregational Nurse Program Note  Date of Encounter: 01/14/2022  Past Medical History: Past Medical History:  Diagnosis Date   Anemia    Breast cancer ([place]) 2002   Right   Cataract    Diabetes mellitus without complication (HCC)    Glaucoma    High cholesterol    Hypertension    Personal history of chemotherapy    Personal history of radiation therapy     Encounter Details:  CCNP Questionnaire completed. Patient expressed a decrease in fine motor skills. Discussed with patient the impact of decrease motor skills to activity of daily living. Patient expressed she was having difficulty with putting on socks and buttoning shirts. Patient goal is to successfully use assistive devices. Assisted patient with finding some assistive devices. Awaiting for patient to receive assistive devices and we will follow up for teaching and education. Continued providing education for patient to perform self glucometer. Will continue education with patient.

## 2022-04-17 ENCOUNTER — Encounter (HOSPITAL_COMMUNITY): Payer: Self-pay

## 2022-04-17 ENCOUNTER — Other Ambulatory Visit: Payer: Self-pay

## 2022-04-17 ENCOUNTER — Emergency Department (HOSPITAL_COMMUNITY)
Admission: EM | Admit: 2022-04-17 | Discharge: 2022-04-18 | Disposition: A | Discharge status: Home / Self Care | Payer: Medicare Other | Attending: Emergency Medicine | Admitting: Emergency Medicine

## 2022-04-17 DIAGNOSIS — I10 Essential (primary) hypertension: Secondary | ICD-10-CM | POA: Insufficient documentation

## 2022-04-17 DIAGNOSIS — Z7982 Long term (current) use of aspirin: Secondary | ICD-10-CM | POA: Insufficient documentation

## 2022-04-17 DIAGNOSIS — Z79899 Other long term (current) drug therapy: Secondary | ICD-10-CM | POA: Diagnosis not present

## 2022-04-17 DIAGNOSIS — E119 Type 2 diabetes mellitus without complications: Secondary | ICD-10-CM | POA: Insufficient documentation

## 2022-04-17 DIAGNOSIS — R040 Epistaxis: Secondary | ICD-10-CM

## 2022-04-17 NOTE — ED Triage Notes (Signed)
Patient states that her BP at home was elevated 202/91. Patient called her PCP today and was told to come to the ED. BP in triage 162/70.  Patient denies any headache or dizziness.

## 2022-04-18 NOTE — Discharge Instructions (Addendum)
Increase your amlodipine from 1/2 a pill to a whole pill every day.  Continue checking your blood pressure once a day.  It may take 1-2 weeks to see a change in your blood pressure with the change in medication dose.

## 2022-04-18 NOTE — ED Provider Notes (Signed)
[facility] DEPT Provider Note   CSN: [id number] Arrival date & time: 04/17/22  1641     History  Chief complaint: Elevated blood pressure  [name] is a 88 y.o. female.  The history is provided by the patient.  She has history of hypertension, diabetes, hyperlipidemia states that last night she had a left-sided nosebleed.  She was worried that her blood pressure was up and took her blood pressure and it was 932 systolic.  She called for EMS to still noted blood pressure greater than 200, so she decided to come in to the emergency department.  She denies headache and tinnitus.  Nosebleed stopped spontaneously.  She states that she does check her blood pressure every day, and typical readings are between 671 and 245 systolic.   Home Medications Prior to Admission medications   Medication Sig Start Date End Date Taking? Authorizing Provider  acetaminophen (TYLENOL) 500 MG tablet Take 1 tablet (500 mg total) by mouth every 6 (six) hours as needed. 08/25/18   Loura Halt A, NP  amLODipine (NORVASC) 5 MG tablet Take 5 mg by mouth daily.    [provider]  aspirin EC 81 MG tablet Take 81 mg by mouth at bedtime. Reported on 09/27/2015    [provider]  diclofenac sodium (VOLTAREN) 1 % GEL Apply 4 g topically 4 (four) times daily. 08/25/18   Loura Halt A, NP  naproxen (NAPROSYN) 375 MG tablet Take 1 tablet (375 mg total) by mouth 2 (two) times daily. 01/17/20   Hayden Rasmussen, MD  valsartan (DIOVAN) 160 MG tablet Take 1 tablet (160 mg total) by mouth daily. 01/28/14   Charolette Forward, MD      Allergies    Patient has no known allergies.    Review of Systems   Review of Systems  All other systems reviewed and are negative.   Physical Exam Updated Vital Signs BP (!) 186/77   Pulse 72   Temp 98.2 F (36.8 C)   Resp 16   Ht '4\' 11"'$  (1.499 m)   Wt 61.7 kg   SpO2 100%   BMI 27.47 kg/m  Physical Exam Vitals and nursing note  reviewed.   87 year old female, resting comfortably and in no acute distress. Vital signs are significant for elevated blood pressure. Oxygen saturation is 100%, which is normal. Head is normocephalic and atraumatic. PERRLA, EOMI. Oropharynx is clear.  Examination of the nose shows normal septum and no evidence of recent bleeding.  Fundi are poorly visualized, but no obvious hemorrhage or exudate. Neck is nontender and supple without adenopathy or JVD. Back is nontender and there is no CVA tenderness. Lungs are clear without rales, wheezes, or rhonchi. Chest is nontender. Heart has regular rate and rhythm without murmur. Abdomen is soft, flat, nontender. Extremities have no cyanosis or edema, full range of motion is present. Skin is warm and dry without rash. Neurologic: Mental status is normal, cranial nerves are intact, moves all extremities equally.  ED Results / Procedures / Treatments    Procedures Procedures    Medications Ordered in ED Medications - No data to display  ED Course/ Medical Decision Making/ A&P                           Medical Decision Making  Left-sided epistaxis which has resolved spontaneously.  Elevated blood pressure.  Blood pressure is still significantly elevated in the emergency department.  However, no evidence of hypertensive urgency.  Since her systolic blood pressures at home are consistently elevated, I have advised her to increase her amlodipine and have her follow-up with her primary care provider.  Final Clinical Impression(s) / ED Diagnoses Final diagnoses:  Elevated blood pressure reading with diagnosis of hypertension  Left-sided epistaxis    Rx / DC Orders ED Discharge Orders     None         Delora Fuel, MD 41/63/84 [phone]

## 2022-04-27 NOTE — Congregational Nurse Program (Signed)
  Dept: [phone]   Congregational Nurse Program Note  Date of Encounter: 01/28/2022  Past Medical History: Past Medical History:  Diagnosis Date   Anemia    Breast cancer ([place]) 2002   Right   Cataract    Diabetes mellitus without complication (HCC)    Glaucoma    High cholesterol    Hypertension    Personal history of chemotherapy    Personal history of radiation therapy     Encounter Details:  CCNP Questionnaire completed and in flowsheet. Patient expressed increase in urine frequency. Nurse encourage patient to form a voiding schedule. Encourage patient to void every 2-3 hours on voiding schedule. Reminded patient to void as needed and during voiding schedule. Patient agreed this would be a measurable goal to achieve. Also assisted patient in collecting her own glucose using her own personal mechanism. Patient states she will continue to use education in the following week and report back in the upcoming weeks.

## 2022-05-13 DIAGNOSIS — Z139 Encounter for screening, unspecified: Secondary | ICD-10-CM

## 2022-05-13 LAB — GLUCOSE, POCT (MANUAL RESULT ENTRY): POC Glucose: 127 mg/dl — AB (ref 70–99)

## 2022-05-20 DIAGNOSIS — Z139 Encounter for screening, unspecified: Secondary | ICD-10-CM

## 2022-05-20 LAB — GLUCOSE, POCT (MANUAL RESULT ENTRY): POC Glucose: 132 mg/dl — AB (ref 70–99)

## 2022-05-25 NOTE — Congregational Nurse Program (Signed)
  Dept: [phone]   Congregational Nurse Program Note  Date of Encounter: 05/20/2022  Past Medical History: Past Medical History:  Diagnosis Date   Anemia    Breast cancer ([place]) 2002   Right   Cataract    Diabetes mellitus without complication ([place])    Glaucoma    High cholesterol    Hypertension    Personal history of chemotherapy    Personal history of radiation therapy     Encounter Details:  CNP Questionnaire - 05/20/22 1300       Questionnaire   Ask client: Do you give verbal consent for me to treat you today? Yes    Student Assistance N/A    Location Patient [place]    Visit Setting with Client [employer]    Patient Status Unknown    Insurance Medicare    Insurance/Financial Assistance Referral N/A    Medication N/A    Medical Provider Yes    Screening Referrals Made N/A    Medical Referrals Made N/A    Medical Appointment Made N/A    Recently w/o PCP, now 1st time PCP visit completed due to CNs referral or appointment made N/A    Food N/A    Transportation N/A    Housing/Utilities N/A    Interpersonal Safety N/A    Interventions Advocate/Support;Spiritual Care;Educate;Counsel    Abnormal to Normal Screening Since Last CN Visit N/A    Screenings CN Performed Blood Pressure;Temperature;Pulse Ox;Blood Glucose    Sent Client to Lab for: N/A    Did client attend any of the following based off CNs referral or appointments made? N/A    ED Visit Averted N/A    Life-Saving Intervention Made N/A             Patient seen in client today. Patient wanted vital signs obtained and blood glucose. During visit patient discussed concerns about her sister who recently visited her. Allowed patient to verbally express emotions on the matter. Spiritual consult given. Patient stated she will think about next new care plan goals. Patient stated she would follow up in the next few weeks. Discussed proper hand hygiene during the holidays.

## 2022-05-25 NOTE — Congregational Nurse Program (Signed)
  Dept: [phone]   Congregational Nurse Program Note  Date of Encounter: 05/25/2022  Past Medical History: Past Medical History:  Diagnosis Date   Anemia    Breast cancer ([place]) 2002   Right   Cataract    Diabetes mellitus without complication ([place])    Glaucoma    High cholesterol    Hypertension    Personal history of chemotherapy    Personal history of radiation therapy     Encounter Details:  CNP Questionnaire - 05/25/22 1300       Questionnaire   Ask client: Do you give verbal consent for me to treat you today? Yes    Student Assistance N/A    Location Patient [place]    Visit Setting with Client [employer]    Patient Status Unknown    Insurance Medicare    Insurance/Financial Assistance Referral N/A    Medication N/A    Medical Provider Yes    Screening Referrals Made N/A    Medical Referrals Made N/A    Medical Appointment Made N/A    Recently w/o PCP, now 1st time PCP visit completed due to CNs referral or appointment made N/A    Food N/A    Transportation N/A    Housing/Utilities N/A    Interpersonal Safety N/A    Interventions Spiritual Care    Abnormal to Normal Screening Since Last CN Visit N/A    Screenings CN Performed N/A    Sent Client to Lab for: N/A    Did client attend any of the following based off CNs referral or appointments made? N/A    ED Visit Averted N/A    Life-Saving Intervention Made N/A             Follow up call given to check on patient. Spiritual care given.

## 2022-06-14 NOTE — Congregational Nurse Program (Cosign Needed)
  Dept: [phone]   Congregational Nurse Program Note  Date of Encounter: 06/10/2022  Past Medical History: Past Medical History:  Diagnosis Date   Anemia    Breast cancer ([place]) 2002   Right   Cataract    Diabetes mellitus without complication (HCC)    Glaucoma    High cholesterol    Hypertension    Personal history of chemotherapy    Personal history of radiation therapy     Encounter Details:  CNP Questionnaire - 06/10/22 1400       Questionnaire   Ask client: Do you give verbal consent for me to treat you today? Yes    Student Assistance N/A    Location Patient [place]    Visit Setting with Client [employer]    Patient Status Unknown    Insurance Medicare    Insurance/Financial Assistance Referral N/A    Medication N/A    Medical Provider Yes    Screening Referrals Made N/A    Medical Referrals Made N/A    Medical Appointment Made N/A    Recently w/o PCP, now 1st time PCP visit completed due to CNs referral or appointment made N/A    Food N/A    Transportation N/A    Housing/Utilities N/A    Interpersonal Safety N/A    Interventions Advocate/Support;Spiritual Care;Educate;Counsel    Abnormal to Normal Screening Since Last CN Visit N/A    Screenings CN Performed Blood Pressure;Temperature;Pulse Ox;Weight;Height    Sent Client to Lab for: N/A    Did client attend any of the following based off CNs referral or appointments made? N/A    ED Visit Averted N/A    Life-Saving Intervention Made N/A             Today's Vitals   06/10/22 1400  BP: 110/60  Pulse: 67  Resp: 18  Temp: 98.5 F (36.9 C)  TempSrc: Temporal  SpO2: 99%  Weight: 135 lb (61.2 kg)  Height: '4\' 9"'$  (1.448 m)   Body mass index is 29.21 kg/m.  Patient expressed some discomfort in her right arm. She informed the congregational nurse she expressed this to her primary physician and she is going to try a new medication to assist with pain. Patient has expressed  decreasing coffee intake during this time as well. Education was given to patient when using a heating pad or heated water bottle to effected areas. Nurse expressed for patient to not sleep with heating pad or heated water bottle for risk of impair skin integrity. Patient verbalized understanding of all education. Spiritual care given.

## 2022-06-15 NOTE — Congregational Nurse Program (Signed)
  Dept: [phone]   Congregational Nurse Program Note  Date of Encounter: 05/13/2022  Past Medical History: Past Medical History:  Diagnosis Date   Anemia    Breast cancer ([place]) 2002   Right   Cataract    Diabetes mellitus without complication (HCC)    Glaucoma    High cholesterol    Hypertension    Personal history of chemotherapy    Personal history of radiation therapy     Encounter Details:   Today's Vitals   05/13/22 1500  BP: (!) 154/70  Pulse: 70  Resp: 18  Temp: (!) 97.4 F (36.3 C)  TempSrc: Temporal  SpO2: 96%  Weight: 132 lb (59.9 kg)   Body mass index is 26.66 kg/m.  CCNP Questionnaire completed and in medical record. Patient vitals obtained. Patient given education on hypertension. Patient expressed to nurse she recently had been under some stress as she was taking care of her 22 year old sister with alzheimer for a week. Patient expressed during caring for her sister her blood pressure was slightly increased. Patient has a appointment with her primary physician on 06-02-22. Patient states she will follow up with congregational nurse in the next couple of weeks.

## 2022-06-16 NOTE — Congregational Nurse Program (Signed)
  Dept: [phone]   Congregational Nurse Program Note  Date of Encounter: 04/08/2022  Past Medical History: Past Medical History:  Diagnosis Date   Anemia    Breast cancer ([place]) 2002   Right   Cataract    Diabetes mellitus without complication (HCC)    Glaucoma    High cholesterol    Hypertension    Personal history of chemotherapy    Personal history of radiation therapy     Encounter Details:  Today's Vitals   04/08/22 1400  BP: (!) 108/56  Pulse: 70  Resp: 18  Temp: 97.7 F (36.5 C)  TempSrc: Temporal  SpO2: 99%  Weight: 133 lb (60.3 kg)  Height: '4\' 11"'$  (1.499 m)   Body mass index is 26.86 kg/m.  CCNP Questionnaire completed and in patient medical record. Patient vitals obtained. Patient also brought her own glucometer. Education and demonstration given for glucometer use. Patient complained of some discomfort in her left eye. Patient instructed to follow up with optometrist.  Patient state she will follow up with congregational nurse in a couple of weeks.

## 2022-06-17 NOTE — Congregational Nurse Program (Signed)
  Dept: [phone]   Congregational Nurse Program Note  Date of Encounter: 02/11/2022  Past Medical History: Past Medical History:  Diagnosis Date   Anemia    Breast cancer ([place]) 2002   Right   Cataract    Diabetes mellitus without complication (HCC)    Glaucoma    High cholesterol    Hypertension    Personal history of chemotherapy    Personal history of radiation therapy     Encounter Details:   Today's Vitals   02/11/22 1445  BP: (!) 164/78  Pulse: 77  Resp: 18  Temp: 97.6 F (36.4 C)  TempSrc: Temporal  SpO2: 100%  Weight: 137 lb (62.1 kg)   Body mass index is 26.76 kg/m.  CCNP Questionnaire completed and in medical record. Patient used own glucometer during clinic visit. Patient educated how to use glucometer and process of obtaining a blood glucose. Educational class given on the proper technique of canning and preserving food. Spiritual care provided.

## 2022-06-17 NOTE — Congregational Nurse Program (Signed)
  Dept: [phone]   Congregational Nurse Program Note  Date of Encounter: 03/11/2022  Past Medical History: Past Medical History:  Diagnosis Date   Anemia    Breast cancer ([place]) 2002   Right   Cataract    Diabetes mellitus without complication (HCC)    Glaucoma    High cholesterol    Hypertension    Personal history of chemotherapy    Personal history of radiation therapy     Encounter Details:   Today's Vitals   03/11/22 1600  BP: (!) 140/74  Pulse: 68  Resp: 16  Temp: (!) 97.5 F (36.4 C)  TempSrc: Temporal  SpO2: 99%  Weight: 134 lb (60.8 kg)   Body mass index is 27.06 kg/m.  CCNP Questionnaire completed and in medical record. Educated patient about Diplomatic Services operational officer have a blue angel program with Sales executive for senior citizen who live alone. Patient is down 2 pounds. Spiritual care given .

## 2022-06-17 NOTE — Congregational Nurse Program (Signed)
  Dept: [phone]   Congregational Nurse Program Note  Date of Encounter: 02/18/2022  Past Medical History: Past Medical History:  Diagnosis Date   Anemia    Breast cancer ([place]) 2002   Right   Cataract    Diabetes mellitus without complication (HCC)    Glaucoma    High cholesterol    Hypertension    Personal history of chemotherapy    Personal history of radiation therapy     Encounter Details:   Today's Vitals   02/18/22 1450  BP: 132/74  Pulse: 84  Resp: 18  Temp: 97.6 F (36.4 C)  TempSrc: Temporal  SpO2: 100%  Weight: 136 lb (61.7 kg)  Height: '4\' 11"'$  (1.499 m)   Body mass index is 27.47 kg/m.  CCNP questionnaire completed and in medical record. Patient seen in clinic today. Patient brought in her own glucometer for training on how to properly use device. Patient states she is having some difficulty with fine and gross movement in hands. Patient has purchased a sock aid and button hook to assist with dressing at home. Nurse is assisting patient learn how to operate this devices.

## 2022-06-17 NOTE — Congregational Nurse Program (Signed)
  Dept: [phone]   Congregational Nurse Program Note  Date of Encounter: 06/17/2022  Past Medical History: Past Medical History:  Diagnosis Date   Anemia    Breast cancer ([place]) 2002   Right   Cataract    Diabetes mellitus without complication (HCC)    Glaucoma    High cholesterol    Hypertension    Personal history of chemotherapy    Personal history of radiation therapy     Encounter Details:  CNP Questionnaire - 06/17/22 1617       Questionnaire   Ask client: Do you give verbal consent for me to treat you today? Yes    Student Assistance N/A    Location Patient [place]    Visit Setting with Client [employer]    Patient Status Unknown    Insurance Medicare    Insurance/Financial Assistance Referral N/A    Medication N/A    Medical Provider Yes    Screening Referrals Made N/A    Medical Referrals Made N/A    Medical Appointment Made N/A    Recently w/o PCP, now 1st time PCP visit completed due to CNs referral or appointment made N/A    Food N/A    Transportation N/A    Housing/Utilities N/A    Interpersonal Safety N/A    Interventions Advocate/Support;Spiritual Care;Educate;Counsel    Abnormal to Normal Screening Since Last CN Visit N/A    Screenings CN Performed Blood Pressure;Temperature;Pulse Ox    Sent Client to Lab for: N/A    Did client attend any of the following based off CNs referral or appointments made? N/A    ED Visit Averted N/A    Life-Saving Intervention Made N/A             Today's Vitals   06/17/22 1612  BP: 130/64  Pulse: 71  Resp: 18  Temp: 97.6 F (36.4 C)  TempSrc: Temporal  SpO2: 98%   There is no height or weight on file to calculate BMI.  Patient used own device to collect blood glucose. Patient blood glucose was 133 random-not fasting. Patient stated she recently informed her physician about several nose bleeds she has experiencing. Nurse informed patient to slightly apply pressure to nose for nose  bleed and informed patient a cool mist humidifier or using a moisturizer in the nose like petroleum jelly at night may help with nose bleeds. Patient stated she was slightly stressed as her brother was recently diagnosis with alzheimer. Spiritual care given.

## 2022-07-15 DIAGNOSIS — Z139 Encounter for screening, unspecified: Secondary | ICD-10-CM

## 2022-07-15 LAB — GLUCOSE, POCT (MANUAL RESULT ENTRY): POC Glucose: 101 mg/dl — AB (ref 70–99)

## 2022-07-19 DIAGNOSIS — Z139 Encounter for screening, unspecified: Secondary | ICD-10-CM

## 2022-07-19 LAB — GLUCOSE, POCT (MANUAL RESULT ENTRY)
POC Glucose: 165 mg/dl — AB (ref 70–99)
POC Glucose: 175 mg/dl — AB (ref 70–99)

## 2022-07-22 DIAGNOSIS — Z139 Encounter for screening, unspecified: Secondary | ICD-10-CM

## 2022-07-22 LAB — GLUCOSE, POCT (MANUAL RESULT ENTRY): POC Glucose: 123 mg/dl — AB (ref 70–99)

## 2022-07-22 NOTE — Congregational Nurse Program (Signed)
  Dept: [phone]   Congregational Nurse Program Note  Date of Encounter: 07/22/2022  Past Medical History: Past Medical History:  Diagnosis Date   Anemia    Breast cancer ([place]) 2002   Right   Cataract    Diabetes mellitus without complication (HCC)    Glaucoma    High cholesterol    Hypertension    Personal history of chemotherapy    Personal history of radiation therapy     Encounter Details:  CNP Questionnaire - 07/22/22 1400       Questionnaire   Ask client: Do you give verbal consent for me to treat you today? Yes    Student Assistance N/A    Location Patient [name]    Visit Setting with Client [employer]    Patient Status Unknown    Insurance Medicare    Insurance/Financial Assistance Referral N/A    Medication N/A    Medical Provider Yes    Screening Referrals Made N/A    Medical Referrals Made N/A    Medical Appointment Made N/A    Recently w/o PCP, now 1st time PCP visit completed due to CNs referral or appointment made N/A    Food N/A    Transportation N/A    Housing/Utilities N/A    Interpersonal Safety N/A    Interventions Advocate/Support;Spiritual Care;Educate;Counsel    Abnormal to Normal Screening Since Last CN Visit Blood Pressure;Blood Glucose    Screenings CN Performed Blood Pressure;Temperature;Pulse Ox;Blood Glucose;Weight;Height    Sent Client to Lab for: N/A    Did client attend any of the following based off CNs referral or appointments made? N/A    ED Visit Averted N/A    Life-Saving Intervention Made N/A            Today's Vitals   07/22/22 1400  BP: 134/68  Pulse: 72  Resp: 18  Temp: 97.9 F (36.6 C)  TempSrc: Temporal  SpO2: 100%  Weight: 135 lb (61.2 kg)  Height: '4\' 9"'$  (1.448 m)   Body mass index is 29.21 kg/m.   Patient state she is feeling better since the 07/19/22. Patient vitals obtained and glucose. Patient given education about shingles. Patient ask multiple questions about shingles and will  given answers. Patient verbalized understanding of education and performed ask me teach back.

## 2022-07-26 NOTE — Congregational Nurse Program (Signed)
  Dept: [phone]   Congregational Nurse Program Note  Date of Encounter: 07/19/2022  Past Medical History: Past Medical History:  Diagnosis Date   Anemia    Breast cancer ([place]) 2002   Right   Cataract    Diabetes mellitus without complication (HCC)    Glaucoma    High cholesterol    Hypertension    Personal history of chemotherapy    Personal history of radiation therapy     Encounter Details:  CNP Questionnaire - 07/19/22 1150       Questionnaire   Ask client: Do you give verbal consent for me to treat you today? Yes    Student Assistance N/A    Location Patient [place]    Visit Setting with Client [employer]    Patient Status Unknown    Insurance Medicare    Insurance/Financial Assistance Referral N/A    Medication N/A    Medical Provider Yes    Screening Referrals Made N/A    Medical Referrals Made Urgent Care   EMS called for patient.   Medical Appointment Made N/A    Recently w/o PCP, now 1st time PCP visit completed due to CNs referral or appointment made N/A    Food N/A    Transportation N/A    Housing/Utilities N/A    Interpersonal Safety N/A    Interventions Advocate/Support;Spiritual Care;Educate;Counsel    Abnormal to Normal Screening Since Last CN Visit N/A    Screenings CN Performed Blood Pressure;Temperature;Pulse Ox;Blood Glucose    Sent Client to Lab for: N/A    Did client attend any of the following based off CNs referral or appointments made? Screening    ED Visit Averted Yes    Life-Saving Intervention Made N/A             Today's Vitals   07/19/22 1200  BP: (!) 146/84  Pulse: 83  Resp: 18  Temp: 98 F (36.7 C)  TempSrc: Oral  SpO2: 98%   There is no height or weight on file to calculate BMI.  Patient seen in clinic. Patient expressed she did not feel herself. Nurse asked patient to ellaborate about symptoms. Patient stated she was dizzy, having left arm pain, and was unsteady. Patient did express she did  not eat prior to church and had not ate prior to coming into clinic. Nurse obtained blood glucose. Patient informed nurse symptoms started the moring around 0830 and remained until screening. Nurse encouraged patient to go to urgent care or ER. Patient declined at the time. Nurse educated patient of signs and symptoms of stroke and a heart attack. Patient stated she thought she would feel better if she went home. Nurse encouraged patient to not drive and to call family for support. Patient agreed. Nurse called patient family and family wanted EMS called. Nurse spoke again with patient and family and patient agreed to call EMS. EMS and patient family came to clinic. EMS took over care. EKG performed. Patient declined to go to the ER. Family transported patient home where they continued to monitor patient. Spiritual care given.

## 2022-07-26 NOTE — Congregational Nurse Program (Signed)
  Dept: [phone]   Congregational Nurse Program Note  Date of Encounter: 07/15/2022  Past Medical History: Past Medical History:  Diagnosis Date   Anemia    Breast cancer ([place]) 2002   Right   Cataract    Diabetes mellitus without complication ([place])    Glaucoma    High cholesterol    Hypertension    Personal history of chemotherapy    Personal history of radiation therapy     Encounter Details:  CNP Questionnaire - 07/15/22 1415       Questionnaire   Ask client: Do you give verbal consent for me to treat you today? Yes    Student Assistance N/A    Location Patient [place]    Visit Setting with Client [employer]    Patient Status Unknown    Insurance Medicare    Insurance/Financial Assistance Referral N/A    Medication N/A    Medical Provider Yes    Screening Referrals Made N/A    Medical Referrals Made N/A    Medical Appointment Made N/A    Recently w/o PCP, now 1st time PCP visit completed due to CNs referral or appointment made N/A    Food N/A    Transportation N/A    Housing/Utilities N/A    Interpersonal Safety N/A    Interventions Advocate/Support;Spiritual Care;Educate;Counsel    Abnormal to Normal Screening Since Last CN Visit N/A    Screenings CN Performed Blood Pressure;Temperature;Pulse Ox;Blood Glucose    Sent Client to Lab for: N/A    Did client attend any of the following based off CNs referral or appointments made? N/A    ED Visit Averted N/A    Life-Saving Intervention Made N/A            Today's Vitals   07/15/22 1415  BP: (!) 148/70  Pulse: 75  Resp: 16  Temp: (!) 97.4 F (36.3 C)  TempSrc: Temporal  SpO2: 94%   There is no height or weight on file to calculate BMI.  Patient was seen in clinic. Patient vitals and blood glucose. Patient asked some questions concerning urinary track infection. Nurse gave education about urinary track infection, risk factors, precautions,and treatment. Patient verbalized  understanding of all education. Ask me teach back.

## 2022-08-11 ENCOUNTER — Other Ambulatory Visit: Payer: Self-pay | Admitting: Internal Medicine

## 2022-08-11 DIAGNOSIS — Z1231 Encounter for screening mammogram for malignant neoplasm of breast: Secondary | ICD-10-CM

## 2022-08-12 DIAGNOSIS — Z139 Encounter for screening, unspecified: Secondary | ICD-10-CM

## 2022-08-12 LAB — GLUCOSE, POCT (MANUAL RESULT ENTRY): POC Glucose: 129 mg/dl — AB (ref 70–99)

## 2022-08-19 DIAGNOSIS — Z139 Encounter for screening, unspecified: Secondary | ICD-10-CM

## 2022-08-19 LAB — GLUCOSE, POCT (MANUAL RESULT ENTRY): POC Glucose: 155 mg/dl — AB (ref 70–99)

## 2022-09-01 NOTE — Congregational Nurse Program (Signed)
  Dept: [phone]   Congregational Nurse Program Note  Date of Encounter: 08/12/2022  Past Medical History: Past Medical History:  Diagnosis Date   Anemia    Breast cancer ([place]) 2002   Right   Cataract    Diabetes mellitus without complication (HCC)    Glaucoma    High cholesterol    Hypertension    Personal history of chemotherapy    Personal history of radiation therapy     Encounter Details:  CNP Questionnaire - 08/12/22 1445       Questionnaire   Ask client: Do you give verbal consent for me to treat you today? Yes    Student Assistance N/A    Location Patient [place]    Visit Setting with Client [employer]    Patient Status Unknown    Insurance Medicare    Insurance/Financial Assistance Referral N/A    Medication N/A    Medical Provider Yes    Screening Referrals Made N/A    Medical Referrals Made N/A    Medical Appointment Made N/A    Recently w/o PCP, now 1st time PCP visit completed due to CNs referral or appointment made N/A    Food N/A    Transportation N/A    Housing/Utilities N/A    Interpersonal Safety N/A    Interventions Advocate/Support;Spiritual Care;Educate;Counsel    Abnormal to Normal Screening Since Last CN Visit Blood Pressure;Blood Glucose    Screenings CN Performed Blood Pressure;Temperature;Pulse Ox;Blood Glucose    Sent Client to Lab for: N/A    Did client attend any of the following based off CNs referral or appointments made? N/A    ED Visit Averted N/A    Life-Saving Intervention Made N/A             Today's Vitals   08/12/22 1445  BP: 118/64  Pulse: 68  Resp: 18  Temp: 98.4 F (36.9 C)  TempSrc: Temporal  SpO2: 99%   There is no height or weight on file to calculate BMI. Patient seen in clinic. Vitals and blood glucose obtained. Patient participated in hands only CPR class. Patient able to verbalize safety techniques for unresponsive person with no pulse. Patient stated at this time she has a DNR  and were given several copies from her physician. Patient stated she keeps the official copy in her car with her at all times. Also dicussed more about assistive devices she may use such as a Secondary school teacher. Patient states she has decreased strength in her hands and we discussed assistive devices to help her. Patient given spiritual care. Will follow up with patient in a couple of weeks

## 2022-09-01 NOTE — Congregational Nurse Program (Signed)
  Dept: [phone]   Congregational Nurse Program Note  Date of Encounter: 08/19/2022  Past Medical History: Past Medical History:  Diagnosis Date   Anemia    Breast cancer ([place]) 2002   Right   Cataract    Diabetes mellitus without complication (HCC)    Glaucoma    High cholesterol    Hypertension    Personal history of chemotherapy    Personal history of radiation therapy     Encounter Details:  CNP Questionnaire - 08/19/22 1400       Questionnaire   Ask client: Do you give verbal consent for me to treat you today? Yes    Student Assistance N/A    Location Patient [name]    Visit Setting with Client [employer]    Patient Status Unknown    Insurance Medicare    Insurance/Financial Assistance Referral N/A    Medication N/A    Medical Provider Yes    Screening Referrals Made N/A    Medical Referrals Made N/A    Medical Appointment Made N/A    Recently w/o PCP, now 1st time PCP visit completed due to CNs referral or appointment made N/A    Food N/A    Transportation N/A    Housing/Utilities N/A    Interpersonal Safety N/A    Interventions Advocate/Support;Spiritual Care;Educate;Counsel    Abnormal to Normal Screening Since Last CN Visit Blood Pressure;Blood Glucose    Screenings CN Performed Blood Pressure;Temperature;Pulse Ox;Blood Glucose    Sent Client to Lab for: N/A    Did client attend any of the following based off CNs referral or appointments made? N/A    ED Visit Averted N/A    Life-Saving Intervention Made N/A             Today's Vitals   08/19/22 1400  BP: 134/64  Pulse: 72  Resp: 18  Temp: 98.7 F (37.1 C)  TempSrc: Temporal  SpO2: 100%  PainSc: 7   PainLoc: Arm   There is no height or weight on file to calculate BMI.  Patient seen in clinic today. Vital signs and blood glucose obtained. Patient also given educational class about hands only CPR. Patient also given alternative method for chest compression r/t  arthritis. Patient able to verbalize education given. Patient states she know ways to help a person without a pulse and is not responsive.

## 2022-09-23 NOTE — Progress Notes (Unsigned)
Cardiology Office Note:    Date:  09/23/2022   ID:  ARAIYA TRAMONTE, DOB 15-Sep-1927, MRN [id number]  PCP:  Nolene Ebbs, MD   [place] Providers Cardiologist:  None { Click to update primary MD,subspecialty MD or APP then REFRESH:1}    Referring MD: Nolene Ebbs, MD   No chief complaint on file. ***  History of Present Illness:    [name] is a 89 y.o. female with a hx of  R breast CA s/[ chemo and radiation, DM2, referral noted for "bunble Bland Rudzinski block" from [facility]. Today EKG shows ***. She denies angina, dyspnea on exertion, lower extremity edema, PND or orthopnea.    Past Medical History:  Diagnosis Date   Anemia    Breast cancer ([place]) 2002   Right   Cataract    Diabetes mellitus without complication (HCC)    Glaucoma    High cholesterol    Hypertension    Personal history of chemotherapy    Personal history of radiation therapy     Past Surgical History:  Procedure Laterality Date   ABDOMINAL HYSTERECTOMY     APPENDECTOMY     BREAST LUMPECTOMY Right    EYE SURGERY      Current Medications: No outpatient medications have been marked as taking for the 09/24/22 encounter (Appointment) with Janina Mayo, MD.     Allergies:   Patient has no known allergies.   Social History   Socioeconomic History   Marital status: Married    Spouse name: Not on file   Number of children: Not on file   Years of education: Not on file   Highest education level: Not on file  Occupational History   Not on file  Tobacco Use   Smoking status: Never   Smokeless tobacco: Never  Vaping Use   Vaping Use: Never used  Substance and Sexual Activity   Alcohol use: No   Drug use: No   Sexual activity: Never  Other Topics Concern   Not on file  Social History Narrative   Not on file   Social Determinants of Health   Financial Resource Strain: Not on file  Food Insecurity: Not on file  Transportation Needs: Not on file  Physical  Activity: Not on file  Stress: Not on file  Social Connections: Not on file     Family History: The patient's ***family history includes Diabetes in her brother, brother, and maternal grandmother; Hyperlipidemia in her brother and sister; Stroke in her maternal grandfather. There is no history of Breast cancer.  ROS:   Please see the history of present illness.    *** All other systems reviewed and are negative.  EKGs/Labs/Other Studies Reviewed:    The following studies were reviewed today: ***  EKG:  EKG is *** ordered today.  The ekg ordered today demonstrates ***  Recent Labs: No results found for requested labs within last 365 days.  Recent Lipid Panel    Component Value Date/Time   CHOL 189 09/05/2020 0000   TRIG 112 09/05/2020 0000   HDL 58 09/05/2020 0000   CHOLHDL 3.3 09/05/2020 0000   LDLCALC 109 (H) 09/05/2020 0000     Risk Assessment/Calculations:   {Does this patient have ATRIAL FIBRILLATION?:[phone]}  No BP recorded.  {Refresh Note OR Click here to enter BP  :1}***         Physical Exam:    VS:  There were no vitals taken for this visit.  Wt Readings from Last 3 Encounters:  07/22/22 135 lb (61.2 kg)  06/10/22 135 lb (61.2 kg)  05/13/22 132 lb (59.9 kg)     GEN: *** Well nourished, well developed in no acute distress HEENT: Normal NECK: No JVD; No carotid bruits LYMPHATICS: No lymphadenopathy CARDIAC: ***RRR, no murmurs, rubs, gallops RESPIRATORY:  Clear to auscultation without rales, wheezing or rhonchi  ABDOMEN: Soft, non-tender, non-distended MUSCULOSKELETAL:  No edema; No deformity  SKIN: Warm and dry NEUROLOGIC:  Alert and oriented x 3 PSYCHIATRIC:  Normal affect   ASSESSMENT:    No diagnosis found. PLAN:    In order of problems listed above:  ***      {Are you ordering a CV Procedure (e.g. stress test, cath, DCCV, TEE, etc)?   Press F2        :[id number]    Medication Adjustments/Labs and Tests Ordered: Current  medicines are reviewed at length with the patient today.  Concerns regarding medicines are outlined above.  No orders of the defined types were placed in this encounter.  No orders of the defined types were placed in this encounter.   There are no Patient Instructions on file for this visit.   Signed, Janina Mayo, MD  09/23/2022 4:27 PM    [place]

## 2022-09-24 ENCOUNTER — Encounter: Payer: Self-pay | Admitting: Internal Medicine

## 2022-09-24 ENCOUNTER — Ambulatory Visit: Payer: Medicare Other | Attending: Internal Medicine | Admitting: Internal Medicine

## 2022-09-24 ENCOUNTER — Ambulatory Visit: Payer: Medicare Other

## 2022-09-24 VITALS — BP 126/60 | HR 68 | Ht <= 58 in | Wt 140.4 lb

## 2022-09-24 DIAGNOSIS — I447 Left bundle-branch block, unspecified: Secondary | ICD-10-CM

## 2022-09-24 NOTE — Patient Instructions (Signed)
Medication Instructions:  Your physician recommends that you continue on your current medications as directed. Please refer to the Current Medication list given to you today.  *If you need a refill on your cardiac medications before your next appointment, please call your pharmacy*   Lab Work: NONE If you have labs (blood work) drawn today and your tests are completely normal, you will receive your results only by: MyChart Message (if you have MyChart) OR A paper copy in the mail If you have any lab test that is abnormal or we need to change your treatment, we will call you to review the results.   Testing/Procedures: NONE   Follow-Up: At [facility], you and your health needs are our priority.  As part of our continuing mission to provide you with exceptional heart care, we have created designated Provider Care Teams.  These Care Teams include your primary Cardiologist (physician) and Advanced Practice Providers (APPs -  Physician Assistants and Nurse Practitioners) who all work together to provide you with the care you need, when you need it.  We recommend signing up for the patient portal called "MyChart".  Sign up information is provided on this After Visit Summary.  MyChart is used to connect with patients for Virtual Visits (Telemedicine).  Patients are able to view lab/test results, encounter notes, upcoming appointments, etc.  Non-urgent messages can be sent to your provider as well.   To learn more about what you can do with MyChart, go to [email or web address].    Your next appointment:   As Needed   Provider:   Branch, Mary E, MD    

## 2022-10-14 DIAGNOSIS — Z139 Encounter for screening, unspecified: Secondary | ICD-10-CM

## 2022-10-14 LAB — GLUCOSE, POCT (MANUAL RESULT ENTRY): POC Glucose: 138 mg/dl — AB (ref 70–99)

## 2022-10-21 DIAGNOSIS — Z139 Encounter for screening, unspecified: Secondary | ICD-10-CM

## 2022-10-21 LAB — GLUCOSE, POCT (MANUAL RESULT ENTRY): POC Glucose: 163 mg/dl — AB (ref 70–99)

## 2022-10-27 ENCOUNTER — Ambulatory Visit
Admission: RE | Admit: 2022-10-27 | Discharge: 2022-10-27 | Disposition: A | Discharge status: Home / Self Care | Payer: Medicare Other | Source: Ambulatory Visit | Attending: Internal Medicine | Admitting: Internal Medicine

## 2022-10-27 DIAGNOSIS — Z1231 Encounter for screening mammogram for malignant neoplasm of breast: Secondary | ICD-10-CM

## 2022-11-11 DIAGNOSIS — Z139 Encounter for screening, unspecified: Secondary | ICD-10-CM

## 2022-11-11 LAB — GLUCOSE, POCT (MANUAL RESULT ENTRY): POC Glucose: 121 mg/dl — AB (ref 70–99)

## 2022-11-19 NOTE — Congregational Nurse Program (Signed)
   Dept: [phone]   Congregational Nurse Program Note  Date of Encounter: 10/21/2022  Past Medical History: Past Medical History:  Diagnosis Date   Anemia    Breast cancer ([facility]) 2002   Right   Cataract    Diabetes mellitus without complication (HCC)    Glaucoma    High cholesterol    Hypertension    Personal history of chemotherapy    Personal history of radiation therapy     Encounter Details:  CNP Questionnaire - 10/21/22 1432       Questionnaire   Ask client: Do you give verbal consent for me to treat you today? Yes    Student Assistance N/A    Location Patient Served  [facility]    Visit Setting with Client [employer]    Patient Status Unknown    Insurance Medicare    Insurance/Financial Assistance Referral N/A    Medication N/A    Medical Provider Yes    Screening Referrals Made N/A    Medical Referrals Made N/A    Medical Appointment Made N/A    Recently w/o PCP, now 1st time PCP visit completed due to CNs referral or appointment made N/A    Food N/A    Transportation N/A    Housing/Utilities N/A    Interpersonal Safety N/A    Interventions Advocate/Support;Spiritual Care;Educate;Counsel    Abnormal to Normal Screening Since Last CN Visit Blood Pressure    Screenings CN Performed Blood Pressure;Temperature;Pulse Ox;Blood Glucose    Sent Client to Lab for: N/A    Did client attend any of the following based off CNs referral or appointments made? N/A    ED Visit Averted N/A    Life-Saving Intervention Made N/A             Today's Vitals   10/21/22 1430  BP: 116/60  Pulse: 67  Resp: 16  Temp: 97.8 F (36.6 C)  TempSrc: Temporal  SpO2: 96%   There is no height or weight on file to calculate BMI.  Patient came into clinic stated she didn't feel well. Vitals and blood glucose obtained. Patient stated she was extremely bloated, abdominal cramping, and some occasional diarrhea. Patient was asked several questions for nurse to have a  greater understanding. Patient denies nausea and denies any colon health issues. Patient questions about recent diet. Patient expressed she had eaten a whole pot of beans. Patient encouraged to inform PCP,to eat a low fiber/brat diet, and increase water intake. Patient verbalized understanding to all education. Patient states she will follow up and she will give a update.

## 2022-11-19 NOTE — Congregational Nurse Program (Signed)
  Dept: [phone]   Congregational Nurse Program Note  Date of Encounter: 11/11/2022  Past Medical History: Past Medical History:  Diagnosis Date   Anemia    Breast cancer ([facility]) 2002   Right   Cataract    Diabetes mellitus without complication (HCC)    Glaucoma    High cholesterol    Hypertension    Personal history of chemotherapy    Personal history of radiation therapy     Encounter Details:  CNP Questionnaire - 11/11/22 1551       Questionnaire   Ask client: Do you give verbal consent for me to treat you today? Yes    Student Assistance N/A    Location [facility] Served  [facility]    Visit Setting with Client [employer]    Patient Status Unknown    Insurance Medicare    Insurance/Financial Assistance Referral N/A    Medication N/A    Medical Provider Yes    Screening Referrals Made N/A    Medical Referrals Made N/A    Medical Appointment Made N/A    Recently w/o PCP, now 1st time PCP visit completed due to CNs referral or appointment made N/A    Food N/A    Transportation N/A    Housing/Utilities N/A    Interpersonal Safety N/A    Interventions Advocate/Support;Spiritual Care;Educate;Counsel    Abnormal to Normal Screening Since Last CN Visit N/A    Screenings CN Performed Blood Pressure;Temperature;Pulse Ox;Blood Glucose    Sent Client to Lab for: N/A    Did client attend any of the following based off CNs referral or appointments made? N/A    ED Visit Averted N/A    Life-Saving Intervention Made N/A             Today's Vitals   11/11/22 1543  BP: 134/72  Pulse: 72  Resp: 18  Temp: 97.9 F (36.6 C)  TempSrc: Temporal  SpO2: 96%  PainSc: 0-No pain   There is no height or weight on file to calculate BMI.  Patient seen in clinic. Vitals and blood glucose obtained. Patient denies pain at this time. Patient given educational class about healthy eating and how to make homemade dressing for salads. Patient also educated about reading  nutrition label. Patient states understanding to all education. Patient state she will continue to follow up with nurse.

## 2022-11-19 NOTE — Congregational Nurse Program (Signed)
  Dept: [phone]   Congregational Nurse Program Note  Date of Encounter: 10/14/2022  Past Medical History: Past Medical History:  Diagnosis Date   Anemia    Breast cancer ([facility]) 2002   Right   Cataract    Diabetes mellitus without complication (HCC)    Glaucoma    High cholesterol    Hypertension    Personal history of chemotherapy    Personal history of radiation therapy     Encounter Details:  CCNP Questionnaire completed and in medical record.   Today's Vitals   10/14/22 1604  BP: 126/66  Pulse: 66  Resp: 18  Temp: (!) 97.5 F (36.4 C)  TempSrc: Temporal  SpO2: 99%  Weight: 136 lb (61.7 kg)  Height: 4\' 9"  (1.448 m)  PainSc: 0-No pain   Body mass index is 29.43 kg/m.  Patient seen in clinic. Patient wanted vitals and blood glucose collected. Patient denies pain at this time. Patient states she ate recently and blood glucose was slightly elevated. Patient expressed she was eating healthier as she is eating more vegetables. Patient expressed she needs to drink more water daily and its a personal goal for patient. Patient state she will follow up with congregational nurse in a couple of weeks.

## 2023-01-20 LAB — BASIC METABOLIC PANEL: Glucose: 146

## 2023-01-20 NOTE — Congregational Nurse Program (Signed)
  Dept: [phone]   Congregational Nurse Program Note  Date of Encounter: 01/20/2023  Past Medical History: Past Medical History:  Diagnosis Date   Anemia    Breast cancer ([facility]) 2002   Right   Cataract    Diabetes mellitus without complication (HCC)    Glaucoma    High cholesterol    Hypertension    Personal history of chemotherapy    Personal history of radiation therapy     Encounter Details:  CNP Questionnaire - 01/20/23 1613       Questionnaire   Ask client: Do you give verbal consent for me to treat you today? Yes    Student Assistance N/A    [place] [facility] Served  [facility]    Visit Setting with Client [employer]    Patient Status Unknown    Insurance Medicare    Insurance/Financial Assistance Referral N/A    Medication N/A    Medical Provider Yes    Screening Referrals Made N/A    Medical Referrals Made N/A    Medical Appointment Made N/A    Recently w/o PCP, now 1st time PCP visit completed due to CNs referral or appointment made N/A    Food N/A    Transportation N/A    Housing/Utilities N/A    Interpersonal Safety N/A    Interventions Advocate/Support;Spiritual Care;Educate;Counsel    Abnormal to Normal Screening Since Last CN Visit Blood Pressure    Screenings CN Performed Blood Pressure;Temperature;Pulse Ox;Blood Glucose;Weight    Sent Client to Lab for: N/A    Did client attend any of the following based off CNs referral or appointments made? N/A    ED Visit Averted N/A    Life-Saving Intervention Made N/A             Today's Vitals   01/20/23 1607  BP: 122/64  Pulse: 81  Resp: 18  Temp: 97.9 F (36.6 C)  TempSrc: Temporal  SpO2: 99%  Weight: 135 lb (61.2 kg)   Body mass index is 29.21 kg/m.  Patient seen in clinic. Vitals, weight, and blood glucose. Patient blood glucose slightly elevated. Patient explain she has not been eating as well lately, her diet has included an increase in sugars. Patient also expressed some pain  in her "buttock". Discussed some exercised and expressed with pain to discuss with her primary physician. Spiritual care given. Patient also expressed the good news she was awaiting the arrival of two great grandchildren.

## 2023-07-10 ENCOUNTER — Emergency Department (HOSPITAL_COMMUNITY): Payer: Medicare Other

## 2023-07-10 ENCOUNTER — Other Ambulatory Visit: Payer: Self-pay

## 2023-07-10 ENCOUNTER — Encounter (HOSPITAL_COMMUNITY): Payer: Self-pay | Admitting: *Deleted

## 2023-07-10 ENCOUNTER — Emergency Department (HOSPITAL_COMMUNITY)
Admission: EM | Admit: 2023-07-10 | Discharge: 2023-07-10 | Disposition: A | Discharge status: Home / Self Care | Payer: Medicare Other | Attending: Emergency Medicine | Admitting: Emergency Medicine

## 2023-07-10 DIAGNOSIS — I447 Left bundle-branch block, unspecified: Secondary | ICD-10-CM | POA: Diagnosis not present

## 2023-07-10 DIAGNOSIS — R55 Syncope and collapse: Secondary | ICD-10-CM | POA: Diagnosis present

## 2023-07-10 DIAGNOSIS — E119 Type 2 diabetes mellitus without complications: Secondary | ICD-10-CM | POA: Insufficient documentation

## 2023-07-10 DIAGNOSIS — Z1152 Encounter for screening for COVID-19: Secondary | ICD-10-CM | POA: Insufficient documentation

## 2023-07-10 DIAGNOSIS — R6889 Other general symptoms and signs: Secondary | ICD-10-CM

## 2023-07-10 DIAGNOSIS — I10 Essential (primary) hypertension: Secondary | ICD-10-CM | POA: Insufficient documentation

## 2023-07-10 DIAGNOSIS — R9082 White matter disease, unspecified: Secondary | ICD-10-CM | POA: Diagnosis not present

## 2023-07-10 DIAGNOSIS — R4182 Altered mental status, unspecified: Secondary | ICD-10-CM | POA: Insufficient documentation

## 2023-07-10 DIAGNOSIS — Z79899 Other long term (current) drug therapy: Secondary | ICD-10-CM | POA: Insufficient documentation

## 2023-07-10 DIAGNOSIS — Z853 Personal history of malignant neoplasm of breast: Secondary | ICD-10-CM | POA: Insufficient documentation

## 2023-07-10 DIAGNOSIS — I700 Atherosclerosis of aorta: Secondary | ICD-10-CM | POA: Insufficient documentation

## 2023-07-10 DIAGNOSIS — R41840 Attention and concentration deficit: Secondary | ICD-10-CM | POA: Insufficient documentation

## 2023-07-10 LAB — RAPID URINE DRUG SCREEN, HOSP PERFORMED
Amphetamines: NOT DETECTED
Barbiturates: NOT DETECTED
Benzodiazepines: NOT DETECTED
Cocaine: NOT DETECTED
Opiates: NOT DETECTED
Tetrahydrocannabinol: NOT DETECTED

## 2023-07-10 LAB — CBC WITH DIFFERENTIAL/PLATELET
Abs Immature Granulocytes: 0.03 10*3/uL (ref 0.00–0.07)
Basophils Absolute: 0 10*3/uL (ref 0.0–0.1)
Basophils Relative: 0 %
Eosinophils Absolute: 0.1 10*3/uL (ref 0.0–0.5)
Eosinophils Relative: 1 %
HCT: 36.7 % (ref 36.0–46.0)
Hemoglobin: 11.6 g/dL — ABNORMAL LOW (ref 12.0–15.0)
Immature Granulocytes: 0 %
Lymphocytes Relative: 19 %
Lymphs Abs: 1.4 10*3/uL (ref 0.7–4.0)
MCH: 29.8 pg (ref 26.0–34.0)
MCHC: 31.6 g/dL (ref 30.0–36.0)
MCV: 94.3 fL (ref 80.0–100.0)
Monocytes Absolute: 0.5 10*3/uL (ref 0.1–1.0)
Monocytes Relative: 6 %
Neutro Abs: 5.6 10*3/uL (ref 1.7–7.7)
Neutrophils Relative %: 74 %
Platelets: 227 10*3/uL (ref 150–400)
RBC: 3.89 MIL/uL (ref 3.87–5.11)
RDW: 14.8 % (ref 11.5–15.5)
WBC: 7.7 10*3/uL (ref 4.0–10.5)
nRBC: 0 % (ref 0.0–0.2)

## 2023-07-10 LAB — COMPREHENSIVE METABOLIC PANEL
ALT: 14 U/L (ref 0–44)
AST: 33 U/L (ref 15–41)
Albumin: 3.2 g/dL — ABNORMAL LOW (ref 3.5–5.0)
Alkaline Phosphatase: 50 U/L (ref 38–126)
Anion gap: 9 (ref 5–15)
BUN: 15 mg/dL (ref 8–23)
CO2: 22 mmol/L (ref 22–32)
Calcium: 9.3 mg/dL (ref 8.9–10.3)
Chloride: 106 mmol/L (ref 98–111)
Creatinine, Ser: 0.98 mg/dL (ref 0.44–1.00)
GFR, Estimated: 54 mL/min — ABNORMAL LOW (ref >60)
Glucose, Bld: 140 mg/dL — ABNORMAL HIGH (ref 70–99)
Potassium: 4 mmol/L (ref 3.5–5.1)
Sodium: 137 mmol/L (ref 135–145)
Total Bilirubin: 1.3 mg/dL — ABNORMAL HIGH (ref 0.0–1.2)
Total Protein: 6.8 g/dL (ref 6.5–8.1)

## 2023-07-10 LAB — URINALYSIS, ROUTINE W REFLEX MICROSCOPIC
Bilirubin Urine: NEGATIVE
Glucose, UA: NEGATIVE mg/dL
Hgb urine dipstick: NEGATIVE
Ketones, ur: NEGATIVE mg/dL
Leukocytes,Ua: NEGATIVE
Nitrite: NEGATIVE
Protein, ur: NEGATIVE mg/dL
Specific Gravity, Urine: 1.011 (ref 1.005–1.030)
pH: 5 (ref 5.0–8.0)

## 2023-07-10 LAB — RESP PANEL BY RT-PCR (RSV, FLU A&B, COVID)  RVPGX2
Influenza A by PCR: NEGATIVE
Influenza B by PCR: NEGATIVE
Resp Syncytial Virus by PCR: NEGATIVE
SARS Coronavirus 2 by RT PCR: NEGATIVE

## 2023-07-10 LAB — TROPONIN I (HIGH SENSITIVITY)
Troponin I (High Sensitivity): 14 ng/L (ref <18)
Troponin I (High Sensitivity): 13 ng/L (ref <18)

## 2023-07-10 LAB — ETHANOL: Alcohol, Ethyl (B): <10 mg/dL (ref <10)

## 2023-07-10 MED ORDER — ACETAMINOPHEN 325 MG PO TABS
650.0000 mg | ORAL_TABLET | Freq: Once | ORAL | Status: DC
  Filled 2023-07-10: qty 2

## 2023-07-10 NOTE — ED Triage Notes (Signed)
 Patient presents to ED via [facility] patient was at church at a funeral of which she had driven herself. Per bystanders patient was unresponsive for approx. 1 hour before calling EMS they thought she was asleep.  Upon arrival to ed patient was alert oriented recalls driving to church and who's funeral she was going to. Skin cool to touch. Patient was given 500 cc NS and became more responsive.

## 2023-07-10 NOTE — ED Notes (Signed)
 Warm blankets applied

## 2023-07-10 NOTE — ED Provider Notes (Signed)
 [place] [facility] AT [facility] Provider Note   CSN: [id number] Arrival date & time: 07/10/23  1428     History  Chief Complaint  Patient presents with   Altered Mental Status    [name] is a 89 y.o. female with PMH as listed below who presents BIB [facility] patient was at church at a funeral when she was found to be out of it,less responsive and very quiet, difficult to arouse.  Friend at bedside provides additional history.  Per friend at bedside, she could have been in this state or semiunresponsive for as long as 30 to 45 minutes, as fellow funeral goers had seen the patient and thought she was nodding off.  Patient was still wearing her code and had and was noted to be very diaphoretic.  Patient was noted to have low blood pressure with EMS and received 500 cc of fluid, after which her mental status significantly improved.  Friend at bedside now states the patient is at her baseline and acting normally.  Patient states she has a very mild discomfort in the front of her head that is not quite a headache.  She states otherwise she feels normal and woke up this morning feeling normal.  She drove herself to the funeral.  She denied any fever/chills, recent illnesses, asymmetric weakness numbness tingling, chest pain, shortness of breath, palpitations, nausea vomiting diarrhea constipation, abdominal pain, urinary symptoms, leg swelling.  No recent changes to her medications.  Denies any alcohol or drug use.  States she has been eating and drinking normally lately.   Past Medical History:  Diagnosis Date   Anemia    Breast cancer (HCC) 2002   Right   Cataract    Diabetes mellitus without complication (HCC)    Glaucoma    High cholesterol    Hypertension    Personal history of chemotherapy    Personal history of radiation therapy        Home Medications Prior to Admission medications   Medication Sig Start Date End Date Taking? Authorizing Provider   amLODipine  (NORVASC ) 5 MG tablet Take 2.5 mg by mouth daily.   Yes [provider]  brimonidine  (ALPHAGAN ) 0.2 % ophthalmic solution Place 1 drop into the right eye 2 (two) times daily.   Yes [provider]  Cyanocobalamin (VITAMIN B12 SL) Place 1 tablet under the tongue daily after lunch.   Yes [provider]  diclofenac  Sodium (VOLTAREN ) 1 % GEL Apply 2 g topically 4 (four) times daily as needed (for pain- affected areas). 08/25/18  Yes [provider]  dorzolamide -timolol  (COSOPT ) 2-0.5 % ophthalmic solution Place 1 drop into the right eye 2 (two) times daily. 10/27/18  Yes [provider]  ezetimibe  (ZETIA ) 10 MG tablet Take 10 mg by mouth daily. 01/10/18  Yes [provider]  gabapentin  (NEURONTIN ) 100 MG capsule Take 100 mg by mouth at bedtime.   Yes [provider]  losartan  (COZAAR ) 100 MG tablet Take 100 mg by mouth daily. 11/16/17  Yes [provider]  ROCKLATAN  0.02-0.005 % SOLN Place 1 drop into the right eye at bedtime.   Yes [provider]      Allergies    Patient has no known allergies.    Review of Systems   Review of Systems A 10 point review of systems was performed and is negative unless otherwise reported in HPI.  Physical Exam Updated Vital Signs BP (!) 168/146   Pulse 78  Temp 98.1 F (36.7 C) (Oral)   Resp (!) 23   Ht 4' 9 (1.448 m)   Wt 64.9 kg   SpO2 98%   BMI 30.94 kg/m  Physical Exam General: Normal appearing female, lying in bed.  HEENT: PERRLA, Sclera anicteric, MMM, trachea midline.  Cardiology: RRR, no murmurs/rubs/gallops. BL radial and DP pulses equal bilaterally.  Resp: Normal respiratory rate and effort. CTAB, no wheezes, rhonchi, crackles.  Abd: Soft, non-tender, non-distended. No rebound tenderness or guarding.  GU: Deferred. MSK: No peripheral edema or signs of trauma. Extremities without deformity or TTP. No cyanosis or clubbing. Skin: warm, dry. No rashes  or lesions. Back: No CVA tenderness Neuro: A&Ox4, CNs II-XII grossly intact. MAEs. Sensation grossly intact.  Psych: Normal mood and affect.   ED Results / Procedures / Treatments   Labs (all labs ordered are listed, but only abnormal results are displayed) Labs Reviewed  CBC WITH DIFFERENTIAL/PLATELET - Abnormal; Notable for the following components:      Result Value   Hemoglobin 11.6 (*)    All other components within normal limits  COMPREHENSIVE METABOLIC PANEL - Abnormal; Notable for the following components:   Glucose, Bld 140 (*)    Albumin 3.2 (*)    Total Bilirubin 1.3 (*)    GFR, Estimated 54 (*)    All other components within normal limits  RESP PANEL BY RT-PCR (RSV, FLU A&B, COVID)  RVPGX2  URINALYSIS, ROUTINE W REFLEX MICROSCOPIC  ETHANOL  RAPID URINE DRUG SCREEN, HOSP PERFORMED  CBG MONITORING, ED  TROPONIN I (HIGH SENSITIVITY)  TROPONIN I (HIGH SENSITIVITY)    EKG EKG Interpretation Date/Time:  Saturday July 10 2023 14:42:27 EST Ventricular Rate:  68 PR Interval:  178 QRS Duration:  137 QT Interval:  472 QTC Calculation: 502 R Axis:   -29  Text Interpretation: Sinus rhythm Probable left atrial enlargement Left bundle branch block No significant change since last tracing Confirmed by Franklyn Gills [phone]) on 07/10/2023 3:25:07 PM  Radiology CT Head Wo Contrast Result Date: 07/10/2023 CLINICAL DATA:  Altered mental status, unresponsive. EXAM: CT HEAD WITHOUT CONTRAST TECHNIQUE: Contiguous axial images were obtained from the base of the skull through the vertex without intravenous contrast. RADIATION DOSE REDUCTION: This exam was performed according to the departmental dose-optimization program which includes automated exposure control, adjustment of the mA and/or kV according to patient size and/or use of iterative reconstruction technique. COMPARISON:  CT head dated 01/24/2020. FINDINGS: Brain: No evidence of acute infarction, hemorrhage, hydrocephalus,  extra-axial collection or mass lesion/mass effect. There is mild cerebral volume loss with associated ex vacuo dilatation. Periventricular white matter hypoattenuation likely represents chronic small vessel ischemic disease. Vascular: There are vascular calcifications in the carotid siphons. Skull: Normal. Negative for fracture or focal lesion. Sinuses/Orbits: No acute finding. Other: None. IMPRESSION: No acute intracranial process. Electronically Signed   By: Norman Hopper M.D.   On: 07/10/2023 17:12   DG Chest Portable 1 View Result Date: 07/10/2023 CLINICAL DATA:  Altered mental status. EXAM: PORTABLE CHEST 1 VIEW COMPARISON:  01/17/2014 FINDINGS: Normal heart size and mediastinal contours. Aortic atherosclerotic calcifications. Lung volumes are low. No signs of pleural effusion or edema. There is increase coarsened interstitial markings within both lung bases which may reflect sequelae of chronic inflammation or infection. No superimposed airspace consolidation. The visualized osseous structures are unremarkable. IMPRESSION: 1. Low lung volumes. 2. Increased coarsened interstitial markings within both lung bases is favored to represent sequelae of chronic inflammation or infection. Electronically Signed  By: Waddell Calk M.D.   On: 07/10/2023 16:04    Procedures Procedures    Medications Ordered in ED Medications  acetaminophen  (TYLENOL ) tablet 650 mg (0 mg Oral Hold 07/10/23 1701)    ED Course/ Medical Decision Making/ A&P                          Medical Decision Making Amount and/or Complexity of Data Reviewed Labs: ordered. Decision-making details documented in ED Course. Radiology: ordered. Decision-making details documented in ED Course.  Risk OTC drugs.    This patient presents to the ED for concern of episode of unresponsiveness/syncope, this involves an extensive number of treatment options, and is a complaint that carries with it a high risk of complications and morbidity.   I considered the following differential and admission for this acute, potentially life threatening condition.   MDM:    Ddx of acute altered mental status/syncope considered but not limited to: -Intracranial abnormalities such as ICH, hydrocephalus. Endorses a mild frontal discomfort but no h/o head trauma.  -Infection such as UTI, PNA - no reported illnesses recently but with weakness/LOC will consider -No reported toxic ingestion, no significantly sedating medications noted on her home medication list -Electrolyte abnormalities or hyper/hypoglycemia -ACS or arrhythmia -no chest pain or shortness of breath to indicate ACS and EKG shows left bundle branch block which is her baseline, no arrhythmias -Anemia -No chest pain or shortness of breath, no signs or symptoms of DVT on exam to indicate pulmonary embolism -High degree of concern for vasovagal episode as patient was noted to be diaphoretic and found to be hypotensive with EMS which resolved after fluid administration.  She states she feels completely normal at this time.   Clinical Course as of 07/10/23 2002  Sat Jul 10, 2023  1644 Troponin I (High Sensitivity): 14 neg [HN]  1644 Alcohol, Ethyl (B): <10 neg [HN]  1645 CBC with Differential(!) Unremarkable in the context of this patient's presentation  [HN]  1645 DG Chest Portable 1 View 1. Low lung volumes. 2. Increased coarsened interstitial markings within both lung bases is favored to represent sequelae of chronic inflammation or infection.   [HN]  1958 Troponin I (High Sensitivity): 13 neg [HN]  2001 Patient's workup is very reassuring.  She has been feeling well throughout her greater than 5-hour stay in the emergency department.  Patient is instructed to stay well-hydrated at home and to follow-up with her primary care physician within 1 week.  She is given discharge instructions and return precautions, all questions answered to patient satisfaction. [HN]    Clinical  Course User Index [HN] Franklyn Sid SAILOR, MD    Labs: I Ordered, and personally interpreted labs.  The pertinent results include:  those listed above  Imaging Studies ordered: I ordered imaging studies including CXR I independently visualized and interpreted imaging. I agree with the radiologist interpretation  Additional history obtained from chart review, church friend at bedside  Cardiac Monitoring: The patient was maintained on a cardiac monitor.  I personally viewed and interpreted the cardiac monitored which showed an underlying rhythm of: Normal sinus rhythm with left bundle branch block  Reevaluation: After the interventions noted above, I reevaluated the patient and found that they have :resolved  Social Determinants of Health:  lives independently  Disposition:  DC  Co morbidities that complicate the patient evaluation  Past Medical History:  Diagnosis Date   Anemia    Breast cancer (HCC) 2002  Right   Cataract    Diabetes mellitus without complication (HCC)    Glaucoma    High cholesterol    Hypertension    Personal history of chemotherapy    Personal history of radiation therapy      Medicines Meds ordered this encounter  Medications   acetaminophen  (TYLENOL ) tablet 650 mg    I have reviewed the patients home medicines and have made adjustments as needed  Problem List / ED Course: Problem List Items Addressed This Visit       Other   Syncope   Other Visit Diagnoses       Episodes of decreased attentiveness    -  Primary                   This note was created using dictation software, which may contain spelling or grammatical errors.    Franklyn Sid SAILOR, MD 07/10/23 2002

## 2023-07-10 NOTE — ED Notes (Signed)
 Pt c/o "sinus headache" beginning PTA but wants to "wait to see if it goes away."

## 2023-07-10 NOTE — ED Notes (Signed)
 Pt transported to imaging.

## 2023-07-10 NOTE — Discharge Instructions (Signed)
 Thank you for coming to [facility] Emergency Department. You were seen for an episode of decreased responsiveness or passing out. We did an exam, labs, and imaging, and these showed no acute findings. Please stay well-hydrated at home. Please follow up with your primary care provider within 1 week.   Do not hesitate to return to the ED or call 911 if you experience: -Worsening symptoms -Chest pain, shortness of breath -Palpitations -Lightheadedness, passing out -Fevers/chills -Anything else that concerns you

## 2023-07-14 LAB — BASIC METABOLIC PANEL: Glucose: 113

## 2023-07-29 NOTE — Congregational Nurse Program (Signed)
  Dept: [phone]   Congregational Nurse Program Note  Date of Encounter: 07/14/2023  Past Medical History: Past Medical History:  Diagnosis Date   Anemia    Breast cancer (HCC) 2002   Right   Cataract    Diabetes mellitus without complication (HCC)    Glaucoma    High cholesterol    Hypertension    Personal history of chemotherapy    Personal history of radiation therapy     Encounter Details:  Community Questionnaire - 07/14/23 1217       Questionnaire   Ask client: Do you give verbal consent for me to treat you today? Yes    Student Assistance N/A    Location Patient Served  [facility]    Encounter Setting CN site    Population Status Unknown    Insurance Medicare    Insurance/Financial Assistance Referral N/A    Medication N/A    Medical Provider Yes    Screening Referrals Made N/A    Medical Referrals Made N/A    Medical Appointment Completed N/A    CNP Interventions Advocate/Support;Spiritual Care;Educate;Counsel    Screenings CN Performed Blood Pressure;Blood Glucose;Weight    ED Visit Averted N/A    Life-Saving Intervention Made N/A             Today's Vitals   07/14/23 1211  BP: (!) 140/86  Pulse: 67  Resp: 18  Temp: 98 F (36.7 C)  TempSrc: Temporal  SpO2: 98%  Weight: 134 lb (60.8 kg)   Body mass index is 29 kg/m.  The patient presented to the clinic for follow up after a recent emergency room visit for dehydration. She expressed concerns about her fluid intake and the importance of staying hydrated. Patient denies a acute distress for this visit. The patient is recovering from dehydration and had been educated on the importance of maintaining adequate fluid intake. A health goal was set to drink at least 8 to 16 ounces of water upon rising in the morning along with her morning medication.  Educated the patient on the signs and symptoms of dehydration, including dry mouth, fatigue, dizziness, and decreased urine output.    Denny Peon, BSN, RN, CRRN,CMSRN

## 2023-09-05 LAB — BASIC METABOLIC PANEL WITH GFR: Glucose: 147

## 2023-09-15 LAB — BASIC METABOLIC PANEL WITH GFR: Glucose: 124

## 2023-09-25 ENCOUNTER — Inpatient Hospital Stay (HOSPITAL_COMMUNITY)
Admission: EM | Admit: 2023-09-25 | Discharge: 2023-09-27 | DRG: 438 | Disposition: A | Discharge status: Home / Self Care | Attending: Obstetrics and Gynecology | Admitting: Obstetrics and Gynecology

## 2023-09-25 ENCOUNTER — Encounter (HOSPITAL_COMMUNITY): Payer: Self-pay | Admitting: Emergency Medicine

## 2023-09-25 ENCOUNTER — Other Ambulatory Visit: Payer: Self-pay

## 2023-09-25 ENCOUNTER — Emergency Department (HOSPITAL_COMMUNITY)

## 2023-09-25 ENCOUNTER — Inpatient Hospital Stay (HOSPITAL_COMMUNITY)

## 2023-09-25 DIAGNOSIS — R1310 Dysphagia, unspecified: Secondary | ICD-10-CM | POA: Diagnosis present

## 2023-09-25 DIAGNOSIS — R17 Unspecified jaundice: Secondary | ICD-10-CM | POA: Diagnosis present

## 2023-09-25 DIAGNOSIS — Z853 Personal history of malignant neoplasm of breast: Secondary | ICD-10-CM

## 2023-09-25 DIAGNOSIS — J849 Interstitial pulmonary disease, unspecified: Secondary | ICD-10-CM | POA: Diagnosis present

## 2023-09-25 DIAGNOSIS — Z79899 Other long term (current) drug therapy: Secondary | ICD-10-CM | POA: Diagnosis not present

## 2023-09-25 DIAGNOSIS — K851 Biliary acute pancreatitis without necrosis or infection: Principal | ICD-10-CM | POA: Diagnosis present

## 2023-09-25 DIAGNOSIS — Z1152 Encounter for screening for COVID-19: Secondary | ICD-10-CM

## 2023-09-25 DIAGNOSIS — K838 Other specified diseases of biliary tract: Secondary | ICD-10-CM | POA: Diagnosis present

## 2023-09-25 DIAGNOSIS — E785 Hyperlipidemia, unspecified: Secondary | ICD-10-CM | POA: Diagnosis present

## 2023-09-25 DIAGNOSIS — Z83438 Family history of other disorder of lipoprotein metabolism and other lipidemia: Secondary | ICD-10-CM | POA: Diagnosis not present

## 2023-09-25 DIAGNOSIS — J1001 Influenza due to other identified influenza virus with the same other identified influenza virus pneumonia: Secondary | ICD-10-CM | POA: Diagnosis present

## 2023-09-25 DIAGNOSIS — I10 Essential (primary) hypertension: Secondary | ICD-10-CM | POA: Diagnosis present

## 2023-09-25 DIAGNOSIS — Z66 Do not resuscitate: Secondary | ICD-10-CM | POA: Diagnosis present

## 2023-09-25 DIAGNOSIS — K824 Cholesterolosis of gallbladder: Secondary | ICD-10-CM | POA: Diagnosis present

## 2023-09-25 DIAGNOSIS — H401123 Primary open-angle glaucoma, left eye, severe stage: Secondary | ICD-10-CM | POA: Diagnosis present

## 2023-09-25 DIAGNOSIS — H401112 Primary open-angle glaucoma, right eye, moderate stage: Secondary | ICD-10-CM | POA: Diagnosis present

## 2023-09-25 DIAGNOSIS — Z833 Family history of diabetes mellitus: Secondary | ICD-10-CM

## 2023-09-25 DIAGNOSIS — Z923 Personal history of irradiation: Secondary | ICD-10-CM | POA: Diagnosis not present

## 2023-09-25 DIAGNOSIS — J09X1 Influenza due to identified novel influenza A virus with pneumonia: Secondary | ICD-10-CM | POA: Diagnosis not present

## 2023-09-25 DIAGNOSIS — I700 Atherosclerosis of aorta: Secondary | ICD-10-CM | POA: Diagnosis present

## 2023-09-25 DIAGNOSIS — E119 Type 2 diabetes mellitus without complications: Secondary | ICD-10-CM | POA: Diagnosis present

## 2023-09-25 DIAGNOSIS — I447 Left bundle-branch block, unspecified: Secondary | ICD-10-CM | POA: Diagnosis present

## 2023-09-25 DIAGNOSIS — J101 Influenza due to other identified influenza virus with other respiratory manifestations: Principal | ICD-10-CM

## 2023-09-25 DIAGNOSIS — Z823 Family history of stroke: Secondary | ICD-10-CM | POA: Diagnosis not present

## 2023-09-25 DIAGNOSIS — H409 Unspecified glaucoma: Secondary | ICD-10-CM | POA: Diagnosis present

## 2023-09-25 DIAGNOSIS — R109 Unspecified abdominal pain: Secondary | ICD-10-CM | POA: Diagnosis present

## 2023-09-25 DIAGNOSIS — E7800 Pure hypercholesterolemia, unspecified: Secondary | ICD-10-CM | POA: Diagnosis present

## 2023-09-25 DIAGNOSIS — K759 Inflammatory liver disease, unspecified: Secondary | ICD-10-CM

## 2023-09-25 DIAGNOSIS — Z9221 Personal history of antineoplastic chemotherapy: Secondary | ICD-10-CM

## 2023-09-25 DIAGNOSIS — R079 Chest pain, unspecified: Secondary | ICD-10-CM | POA: Diagnosis present

## 2023-09-25 DIAGNOSIS — R7989 Other specified abnormal findings of blood chemistry: Secondary | ICD-10-CM | POA: Diagnosis present

## 2023-09-25 HISTORY — DX: Influenza due to identified novel influenza A virus with pneumonia: J09.X1

## 2023-09-25 LAB — CBC WITH DIFFERENTIAL/PLATELET
Abs Immature Granulocytes: 0.03 10*3/uL (ref 0.00–0.07)
Basophils Absolute: 0 10*3/uL (ref 0.0–0.1)
Basophils Relative: 0 %
Eosinophils Absolute: 0 10*3/uL (ref 0.0–0.5)
Eosinophils Relative: 0 %
HCT: 42.1 % (ref 36.0–46.0)
Hemoglobin: 13.6 g/dL (ref 12.0–15.0)
Immature Granulocytes: 0 %
Lymphocytes Relative: 16 %
Lymphs Abs: 1.7 10*3/uL (ref 0.7–4.0)
MCH: 30.1 pg (ref 26.0–34.0)
MCHC: 32.3 g/dL (ref 30.0–36.0)
MCV: 93.1 fL (ref 80.0–100.0)
Monocytes Absolute: 0.7 10*3/uL (ref 0.1–1.0)
Monocytes Relative: 7 %
Neutro Abs: 7.8 10*3/uL — ABNORMAL HIGH (ref 1.7–7.7)
Neutrophils Relative %: 77 %
Platelets: 213 10*3/uL (ref 150–400)
RBC: 4.52 MIL/uL (ref 3.87–5.11)
RDW: 14.7 % (ref 11.5–15.5)
WBC: 10.2 10*3/uL (ref 4.0–10.5)
nRBC: 0 % (ref 0.0–0.2)

## 2023-09-25 LAB — HEPATITIS PANEL, ACUTE
HCV Ab: NONREACTIVE
Hep A IgM: NONREACTIVE
Hep B C IgM: NONREACTIVE
Hepatitis B Surface Ag: NONREACTIVE

## 2023-09-25 LAB — RESP PANEL BY RT-PCR (RSV, FLU A&B, COVID)  RVPGX2
Influenza A by PCR: POSITIVE — AB
Influenza B by PCR: NEGATIVE
Resp Syncytial Virus by PCR: NEGATIVE
SARS Coronavirus 2 by RT PCR: NEGATIVE

## 2023-09-25 LAB — HEPATIC FUNCTION PANEL
ALT: 157 U/L — ABNORMAL HIGH (ref 0–44)
AST: 606 U/L — ABNORMAL HIGH (ref 15–41)
Albumin: 3.6 g/dL (ref 3.5–5.0)
Alkaline Phosphatase: 102 U/L (ref 38–126)
Bilirubin, Direct: 0.8 mg/dL — ABNORMAL HIGH (ref 0.0–0.2)
Indirect Bilirubin: 1.3 mg/dL — ABNORMAL HIGH (ref 0.3–0.9)
Total Bilirubin: 2.1 mg/dL — ABNORMAL HIGH (ref 0.0–1.2)
Total Protein: 7.5 g/dL (ref 6.5–8.1)

## 2023-09-25 LAB — BASIC METABOLIC PANEL
Anion gap: 7 (ref 5–15)
BUN: 27 mg/dL — ABNORMAL HIGH (ref 8–23)
CO2: 28 mmol/L (ref 22–32)
Calcium: 9.8 mg/dL (ref 8.9–10.3)
Chloride: 102 mmol/L (ref 98–111)
Creatinine, Ser: 1.06 mg/dL — ABNORMAL HIGH (ref 0.44–1.00)
GFR, Estimated: 49 mL/min — ABNORMAL LOW (ref >60)
Glucose, Bld: 142 mg/dL — ABNORMAL HIGH (ref 70–99)
Potassium: 4.5 mmol/L (ref 3.5–5.1)
Sodium: 137 mmol/L (ref 135–145)

## 2023-09-25 LAB — TROPONIN I (HIGH SENSITIVITY)
Troponin I (High Sensitivity): 13 ng/L (ref <18)
Troponin I (High Sensitivity): 14 ng/L (ref <18)

## 2023-09-25 LAB — STREP PNEUMONIAE URINARY ANTIGEN: Strep Pneumo Urinary Antigen: NEGATIVE

## 2023-09-25 LAB — LIPASE, BLOOD: Lipase: 3578 U/L — ABNORMAL HIGH (ref 11–51)

## 2023-09-25 MED ORDER — IOHEXOL 350 MG/ML SOLN
80.0000 mL | Freq: Once | INTRAVENOUS | Status: AC | PRN
  Administered 2023-09-25: 80 mL via INTRAVENOUS

## 2023-09-25 MED ORDER — KETOROLAC TROMETHAMINE 30 MG/ML IJ SOLN
15.0000 mg | Freq: Once | INTRAMUSCULAR | Status: AC
  Administered 2023-09-25: 15 mg via INTRAVENOUS
  Filled 2023-09-25: qty 1

## 2023-09-25 MED ORDER — DORZOLAMIDE HCL-TIMOLOL MAL 2-0.5 % OP SOLN
1.0000 [drp] | Freq: Two times a day (BID) | OPHTHALMIC | Status: DC
  Administered 2023-09-25 – 2023-09-27 (×4): 1 [drp] via OPHTHALMIC
  Filled 2023-09-25: qty 10

## 2023-09-25 MED ORDER — SODIUM CHLORIDE 0.9 % IV SOLN
500.0000 mg | Freq: Once | INTRAVENOUS | Status: AC
  Administered 2023-09-25: 500 mg via INTRAVENOUS
  Filled 2023-09-25: qty 5

## 2023-09-25 MED ORDER — BRIMONIDINE TARTRATE 0.2 % OP SOLN
1.0000 [drp] | Freq: Two times a day (BID) | OPHTHALMIC | Status: DC
  Administered 2023-09-25 – 2023-09-27 (×4): 1 [drp] via OPHTHALMIC
  Filled 2023-09-25: qty 5

## 2023-09-25 MED ORDER — EZETIMIBE 10 MG PO TABS
10.0000 mg | ORAL_TABLET | Freq: Every day | ORAL | Status: DC
  Administered 2023-09-26 – 2023-09-27 (×2): 10 mg via ORAL
  Filled 2023-09-25 (×2): qty 1

## 2023-09-25 MED ORDER — ACETAMINOPHEN 650 MG RE SUPP
650.0000 mg | Freq: Four times a day (QID) | RECTAL | Status: DC | PRN
Start: 2023-09-25 — End: 2023-09-27

## 2023-09-25 MED ORDER — ENSURE ENLIVE PO LIQD
237.0000 mL | Freq: Two times a day (BID) | ORAL | Status: DC
  Administered 2023-09-26 – 2023-09-27 (×4): 237 mL via ORAL

## 2023-09-25 MED ORDER — AMLODIPINE BESYLATE 5 MG PO TABS
5.0000 mg | ORAL_TABLET | Freq: Once | ORAL | Status: DC
  Filled 2023-09-25: qty 1

## 2023-09-25 MED ORDER — GABAPENTIN 100 MG PO CAPS
100.0000 mg | ORAL_CAPSULE | Freq: Every day | ORAL | Status: DC
  Administered 2023-09-25 – 2023-09-26 (×2): 100 mg via ORAL
  Filled 2023-09-25 (×2): qty 1

## 2023-09-25 MED ORDER — SODIUM CHLORIDE 0.9 % IV SOLN
1.0000 g | Freq: Once | INTRAVENOUS | Status: AC
  Administered 2023-09-25: 1 g via INTRAVENOUS
  Filled 2023-09-25: qty 10

## 2023-09-25 MED ORDER — PANTOPRAZOLE SODIUM 40 MG IV SOLR
40.0000 mg | Freq: Once | INTRAVENOUS | Status: AC
  Administered 2023-09-25: 40 mg via INTRAVENOUS
  Filled 2023-09-25: qty 10

## 2023-09-25 MED ORDER — SODIUM CHLORIDE 0.9 % IV SOLN
500.0000 mg | INTRAVENOUS | Status: DC
  Administered 2023-09-26 – 2023-09-27 (×2): 500 mg via INTRAVENOUS
  Filled 2023-09-25 (×2): qty 5

## 2023-09-25 MED ORDER — HYDRALAZINE HCL 25 MG PO TABS
25.0000 mg | ORAL_TABLET | Freq: Four times a day (QID) | ORAL | Status: DC | PRN
  Filled 2023-09-25: qty 1

## 2023-09-25 MED ORDER — PANTOPRAZOLE SODIUM 40 MG PO TBEC
40.0000 mg | DELAYED_RELEASE_TABLET | Freq: Every day | ORAL | Status: DC
  Administered 2023-09-26 – 2023-09-27 (×2): 40 mg via ORAL
  Filled 2023-09-25 (×2): qty 1

## 2023-09-25 MED ORDER — LOSARTAN POTASSIUM 50 MG PO TABS
100.0000 mg | ORAL_TABLET | Freq: Every day | ORAL | Status: DC
  Administered 2023-09-25 – 2023-09-27 (×3): 100 mg via ORAL
  Filled 2023-09-25 (×3): qty 2

## 2023-09-25 MED ORDER — ONDANSETRON HCL 4 MG/2ML IJ SOLN
4.0000 mg | Freq: Once | INTRAMUSCULAR | Status: AC
Start: 2023-09-25 — End: 2023-09-25
  Administered 2023-09-25: 4 mg via INTRAVENOUS
  Filled 2023-09-25: qty 2

## 2023-09-25 MED ORDER — ACETAMINOPHEN 500 MG PO TABS
1000.0000 mg | ORAL_TABLET | Freq: Once | ORAL | Status: AC
  Administered 2023-09-25: 1000 mg via ORAL
  Filled 2023-09-25: qty 2

## 2023-09-25 MED ORDER — ONDANSETRON HCL 4 MG/2ML IJ SOLN: 4.0000 mg | Freq: Four times a day (QID) | INTRAMUSCULAR | Status: DC | PRN

## 2023-09-25 MED ORDER — FENTANYL CITRATE PF 50 MCG/ML IJ SOSY
25.0000 ug | PREFILLED_SYRINGE | Freq: Once | INTRAMUSCULAR | Status: AC
  Administered 2023-09-25: 25 ug via INTRAVENOUS
  Filled 2023-09-25: qty 1

## 2023-09-25 MED ORDER — ONDANSETRON HCL 4 MG PO TABS: 4.0000 mg | ORAL_TABLET | Freq: Four times a day (QID) | ORAL | Status: DC | PRN

## 2023-09-25 MED ORDER — SODIUM CHLORIDE 0.9 % IV SOLN
1.0000 g | INTRAVENOUS | Status: DC
  Administered 2023-09-26 – 2023-09-27 (×2): 1 g via INTRAVENOUS
  Filled 2023-09-25 (×2): qty 10

## 2023-09-25 MED ORDER — ALUM & MAG HYDROXIDE-SIMETH 200-200-20 MG/5ML PO SUSP
30.0000 mL | Freq: Once | ORAL | Status: AC
  Administered 2023-09-25: 30 mL via ORAL
  Filled 2023-09-25: qty 30

## 2023-09-25 MED ORDER — OSELTAMIVIR PHOSPHATE 30 MG PO CAPS
30.0000 mg | ORAL_CAPSULE | Freq: Every day | ORAL | Status: DC
  Administered 2023-09-25: 30 mg via ORAL
  Filled 2023-09-25: qty 1

## 2023-09-25 MED ORDER — FENTANYL CITRATE PF 50 MCG/ML IJ SOSY: 25.0000 ug | PREFILLED_SYRINGE | INTRAMUSCULAR | Status: DC | PRN

## 2023-09-25 MED ORDER — GADOBUTROL 1 MMOL/ML IV SOLN
6.0000 mL | Freq: Once | INTRAVENOUS | Status: AC | PRN
  Administered 2023-09-25: 6 mL via INTRAVENOUS

## 2023-09-25 MED ORDER — ACETAMINOPHEN 325 MG PO TABS: 650.0000 mg | ORAL_TABLET | Freq: Four times a day (QID) | ORAL | Status: DC | PRN

## 2023-09-25 MED ORDER — HYDRALAZINE HCL 20 MG/ML IJ SOLN
5.0000 mg | Freq: Once | INTRAMUSCULAR | Status: AC
Start: 2023-09-25 — End: 2023-09-25
  Administered 2023-09-25: 5 mg via INTRAVENOUS
  Filled 2023-09-25: qty 1

## 2023-09-25 NOTE — ED Notes (Signed)
BP elevated, provider aware

## 2023-09-25 NOTE — H&P (Signed)
 History and Physical    Patient: [name] DOB: 12/19/1927 DOA: 09/25/2023 DOS: the patient was seen and examined on 09/25/2023 PCP: Fleet Contras, MD  Patient coming from: Home  Chief Complaint:  Chief Complaint  Patient presents with   Chest Pain   HPI: [name] is a 89 y.o. female with medical history significant of unspecified anemia, right breast cancer, history of chemo and radiotherapy, cataracts, glaucoma, type 2 diabetes, hyperlipidemia, hypertension who was brought to the emergency department due to having a chest pressure since around 0030 associated with epigastric pain that worsens with palpation of the area.  She has been sick with a cold since 5 days ago.  No prior episodes of these pain in the past.  No  emesis, diarrhea, constipation, melena or hematochezia.  No flank pain, dysuria, frequency or hematuria.  She denied fever, chills, rhinorrhea, sore throat, wheezing or hemoptysis.  No palpitations, diaphoresis, PND, orthopnea or recent pitting edema of the lower extremities.   No polyuria, polydipsia, polyphagia or blurred vision.   Lab work: Positive influenza A PCR, coronavirus, influenza B and RSV PCR was negative.  CBC is her white count 10.2 with 77% neutrophils, hemoglobin 13.6 g/dL and platelets 578.  Imaging: Portable 1 view chest radiograph showed a slight infiltration or atelectasis in the lung bases similar to prior study.  CTA chest with no PE.  Cluster of nodular groundglass opacity in the right upper lobe suspicious for infection or inflammation.  Interstitial disease.  Multinodular thyroid enlargement with a 2.3 cm left thyroid nodule.  No follow-up recommended.   ED course: Initial vital signs were temperature 98 F, pulse 77, respiration 18, BP 148/80 mmHg O2 sat 100% on room air.  The patient received ceftriaxone 1 g IVPB, azithromycin 500 mg IVPB, Maalox 30 mL p.o. x 1, acetaminophen 1000 mg p.o., fentanyl 25 mcg p.o.,  hydralazine 5 mg IVP, ketorolac 15 mg IVP and ondansetron 4 mg IVP.  Review of Systems: As mentioned in the history of present illness. All other systems reviewed and are negative. Past Medical History:  Diagnosis Date   Anemia    Breast cancer (HCC) 2002   Right   Cataract    Diabetes mellitus without complication (HCC)    Glaucoma    High cholesterol    Hypertension    Personal history of chemotherapy    Personal history of radiation therapy    Past Surgical History:  Procedure Laterality Date   ABDOMINAL HYSTERECTOMY     APPENDECTOMY     BREAST LUMPECTOMY Right 2001   EYE SURGERY     Social History:  reports that she has never smoked. She has never used smokeless tobacco. She reports that she does not drink alcohol and does not use drugs.  No Known Allergies  Family History  Problem Relation Age of Onset   Hyperlipidemia Sister    Diabetes Brother    Hyperlipidemia Brother    Diabetes Maternal Grandmother    Stroke Maternal Grandfather    Diabetes Brother    Breast cancer Neg Hx     Prior to Admission medications   Medication Sig Start Date End Date Taking? Authorizing Provider  amLODipine (NORVASC) 5 MG tablet Take 2.5 mg by mouth daily.    [provider]  brimonidine (ALPHAGAN) 0.2 % ophthalmic solution Place 1 drop into the right eye 2 (two) times daily.    [provider]  Cyanocobalamin (VITAMIN B12 SL) Place 1 tablet under the tongue  daily after lunch.    [provider]  diclofenac Sodium (VOLTAREN) 1 % GEL Apply 2 g topically 4 (four) times daily as needed (for pain- affected areas). 08/25/18   [provider]  dorzolamide-timolol (COSOPT) 2-0.5 % ophthalmic solution Place 1 drop into the right eye 2 (two) times daily. 10/27/18   [provider]  ezetimibe (ZETIA) 10 MG tablet Take 10 mg by mouth daily. 01/10/18   [provider]  gabapentin (NEURONTIN) 100 MG capsule Take 100 mg by mouth at bedtime.     [provider]  losartan (COZAAR) 100 MG tablet Take 100 mg by mouth daily. 11/16/17   [provider]  ROCKLATAN 0.02-0.005 % SOLN Place 1 drop into the right eye at bedtime.    [provider]    Physical Exam: Vitals:   09/25/23 0345 09/25/23 0445 09/25/23 0630 09/25/23 0654  BP: (!) 204/75 (!) 193/82 (!) 216/90   Pulse: 71 73 74   Resp: (!) 5 20 (!) 21   Temp:    98.5 F (36.9 C)  TempSrc:    Oral  SpO2: 100% 99% 100%    Physical Exam Vitals reviewed.  Constitutional:      General: She is awake. She is not in acute distress.    Appearance: She is obese. She is ill-appearing.  HENT:     Head: Normocephalic.     Nose: No rhinorrhea.     Mouth/Throat:     Mouth: Mucous membranes are dry.  Eyes:     General: No scleral icterus.    Pupils: Pupils are equal, round, and reactive to light.  Neck:     Vascular: No JVD.  Cardiovascular:     Rate and Rhythm: Normal rate and regular rhythm.     Heart sounds: S1 normal and S2 normal.  Pulmonary:     Effort: Pulmonary effort is normal.     Breath sounds: No wheezing, rhonchi or rales.  Abdominal:     General: Bowel sounds are normal. There is no distension.     Palpations: Abdomen is soft.     Tenderness: There is abdominal tenderness in the right upper quadrant and epigastric area. There is right CVA tenderness. There is no left CVA tenderness, guarding or rebound.  Musculoskeletal:     Cervical back: Neck supple.     Right lower leg: No edema.     Left lower leg: No edema.  Skin:    General: Skin is warm and dry.  Neurological:     General: No focal deficit present.     Mental Status: She is alert and oriented to person, place, and time.  Psychiatric:        Mood and Affect: Mood normal.        Behavior: Behavior normal. Behavior is cooperative.     Data Reviewed:  Results are pending, will review when available. EKG: Vent. rate 74 BPM PR interval 164 ms QRS duration 136 ms QT/QTcB  390/433 ms P-R-T axes 52 14 163 Sinus rhythm Probable left atrial enlargement Left bundle branch block  Assessment and Plan: Principal Problem:   Influenza A with pneumonia Admit to telemetry/inpatient. As needed bronchodilators. Continue ceftriaxone 1 g IVPB daily. Continue azithromycin 500 mg IVPB daily. Check strep pneumoniae urinary antigen. Check sputum Gram stain, culture and sensitivity. Follow-up CBC and chemistry in the morning. Will hold oseltamivir  since symptoms are 5/6 days already  Active Problems:   Abdominal pain Due to:   Acute  gallstone pancreatitis With associated:   Abnormal LFTs (liver function tests) Continue IV fluids. Full liquid diet per GI. Analgesics as needed. Antiemetics as needed. Pantoprazole 40 mg IVP daily. Follow CBC, CMP and lipase in AM. GI consult appreciated. -MRCP was done this morning.    Essential hypertension  Continue amlodipine 10 mg p.o. daily.    Aortic atherosclerosis (HCC)   Hyperlipidemia Continue ezetimibe 10 mg p.o. daily.    Primary open angle glaucoma (POAG) of right eye, moderate stage   Primary open angle glaucoma (POAG) of left eye, severe stage Continue Cosopt and Alphagan drops twice daily each. Follow-up with ophthalmology as an outpatient.    Advance Care Planning:   Code Status: Limited: Do not attempt resuscitation (DNR) -DNR-LIMITED -Do Not Intubate/DNI    Consults: [facility].  Family Communication:   Severity of Illness: The appropriate patient status for this patient is INPATIENT. Inpatient status is judged to be reasonable and necessary in order to provide the required intensity of service to ensure the patient's safety. The patient's presenting symptoms, physical exam findings, and initial radiographic and laboratory data in the context of their chronic comorbidities is felt to place them at high risk for further clinical deterioration. Furthermore, it is not anticipated that the  patient will be medically stable for discharge from the hospital within 2 midnights of admission.   * I certify that at the point of admission it is my clinical judgment that the patient will require inpatient hospital care spanning beyond 2 midnights from the point of admission due to high intensity of service, high risk for further deterioration and high frequency of surveillance required.*  Author: Bobette Mo, MD 09/25/2023 7:51 AM  For on call review [email or web address].   This document was prepared using Dragon voice recognition software and may contain some unintended transcription errors.

## 2023-09-25 NOTE — ED Provider Notes (Addendum)
 [place] [facility]MENT AT [facility] Provider Note   CSN: [id number] Arrival date & time: 09/25/23  0119     History  Chief Complaint  Patient presents with   Chest Pain    [name] is a 89 y.o. female.  The history is provided by the patient.  Chest Pain Pain location:  R chest Pain quality: dull and sharp   Pain radiates to:  Does not radiate Pain severity:  Severe Onset quality:  Gradual Duration:  3 days Timing:  Constant Progression:  Unchanged Chronicity:  New Context: at rest   Relieved by:  Nothing Worsened by:  Nothing Ineffective treatments:  None tried Associated symptoms: cough   Associated symptoms: no abdominal pain, no fever, no nausea, no palpitations, no shortness of breath and no vomiting   Associated symptoms comment:  Sneezing rhinorrhea      Home Medications Prior to Admission medications   Medication Sig Start Date End Date Taking? Authorizing Provider  amLODipine (NORVASC) 5 MG tablet Take 2.5 mg by mouth daily.    [provider]  brimonidine (ALPHAGAN) 0.2 % ophthalmic solution Place 1 drop into the right eye 2 (two) times daily.    [provider]  Cyanocobalamin (VITAMIN B12 SL) Place 1 tablet under the tongue daily after lunch.    [provider]  diclofenac Sodium (VOLTAREN) 1 % GEL Apply 2 g topically 4 (four) times daily as needed (for pain- affected areas). 08/25/18   [provider]  dorzolamide-timolol (COSOPT) 2-0.5 % ophthalmic solution Place 1 drop into the right eye 2 (two) times daily. 10/27/18   [provider]  ezetimibe (ZETIA) 10 MG tablet Take 10 mg by mouth daily. 01/10/18   [provider]  gabapentin (NEURONTIN) 100 MG capsule Take 100 mg by mouth at bedtime.    [provider]  losartan (COZAAR) 100 MG tablet Take 100 mg by mouth daily. 11/16/17   [provider]  ROCKLATAN 0.02-0.005 % SOLN Place 1 drop into the right eye  at bedtime.    [provider]      Allergies    Patient has no known allergies.    Review of Systems   Review of Systems  Constitutional:  Negative for fever.  HENT:  Positive for rhinorrhea and sneezing.   Respiratory:  Positive for cough. Negative for shortness of breath.   Cardiovascular:  Positive for chest pain. Negative for palpitations and leg swelling.  Gastrointestinal:  Negative for abdominal pain, nausea and vomiting.  All other systems reviewed and are negative.   Physical Exam Updated Vital Signs BP (!) 204/75   Pulse 71   Temp 98 F (36.7 C) (Oral)   Resp (!) 5   SpO2 100%  Physical Exam Vitals and nursing note reviewed.  Constitutional:      General: She is not in acute distress.    Appearance: Normal appearance. She is well-developed.  HENT:     Head: Normocephalic and atraumatic.     Nose: Nose normal.  Eyes:     Pupils: Pupils are equal, round, and reactive to light.  Cardiovascular:     Rate and Rhythm: Normal rate and regular rhythm.     Pulses: Normal pulses.     Heart sounds: Normal heart sounds.  Pulmonary:     Effort: Pulmonary effort is normal. No respiratory distress.     Breath sounds: Normal breath sounds.  Abdominal:     General: Bowel sounds are normal.  There is no distension.     Palpations: Abdomen is soft.     Tenderness: There is no abdominal tenderness. There is no guarding or rebound. Negative signs include Murphy's sign and McBurney's sign.  Musculoskeletal:        General: Normal range of motion.     Cervical back: Neck supple.  Skin:    General: Skin is warm and dry.     Capillary Refill: Capillary refill takes less than 2 seconds.     Findings: No erythema or rash.  Neurological:     General: No focal deficit present.     Mental Status: She is alert.     Deep Tendon Reflexes: Reflexes normal.  Psychiatric:        Mood and Affect: Mood normal.     ED Results / Procedures / Treatments   Labs (all labs  ordered are listed, but only abnormal results are displayed) Results for orders placed or performed during the hospital encounter of 09/25/23  CBC with Differential   Collection Time: 09/25/23  1:49 AM  Result Value Ref Range   WBC 10.2 4.0 - 10.5 K/uL   RBC 4.52 3.87 - 5.11 MIL/uL   Hemoglobin 13.6 12.0 - 15.0 g/dL   HCT 99.3 71.6 - 96.7 %   MCV 93.1 80.0 - 100.0 fL   MCH 30.1 26.0 - 34.0 pg   MCHC 32.3 30.0 - 36.0 g/dL   RDW 89.3 81.0 - 17.5 %   Platelets 213 150 - 400 K/uL   nRBC 0.0 0.0 - 0.2 %   Neutrophils Relative % 77 %   Neutro Abs 7.8 (H) 1.7 - 7.7 K/uL   Lymphocytes Relative 16 %   Lymphs Abs 1.7 0.7 - 4.0 K/uL   Monocytes Relative 7 %   Monocytes Absolute 0.7 0.1 - 1.0 K/uL   Eosinophils Relative 0 %   Eosinophils Absolute 0.0 0.0 - 0.5 K/uL   Basophils Relative 0 %   Basophils Absolute 0.0 0.0 - 0.1 K/uL   Immature Granulocytes 0 %   Abs Immature Granulocytes 0.03 0.00 - 0.07 K/uL  Basic metabolic panel   Collection Time: 09/25/23  1:49 AM  Result Value Ref Range   Sodium 137 135 - 145 mmol/L   Potassium 4.5 3.5 - 5.1 mmol/L   Chloride 102 98 - 111 mmol/L   CO2 28 22 - 32 mmol/L   Glucose, Bld 142 (H) 70 - 99 mg/dL   BUN 27 (H) 8 - 23 mg/dL   Creatinine, Ser 1.02 (H) 0.44 - 1.00 mg/dL   Calcium 9.8 8.9 - 58.5 mg/dL   GFR, Estimated 49 (L) >60 mL/min   Anion gap 7 5 - 15  Troponin I (High Sensitivity)   Collection Time: 09/25/23  1:49 AM  Result Value Ref Range   Troponin I (High Sensitivity) 13 <18 ng/L  Resp panel by RT-PCR (RSV, Flu A&B, Covid) Anterior Nasal Swab   Collection Time: 09/25/23  4:00 AM   Specimen: Anterior Nasal Swab  Result Value Ref Range   SARS Coronavirus 2 by RT PCR NEGATIVE NEGATIVE   Influenza A by PCR POSITIVE (A) NEGATIVE   Influenza B by PCR NEGATIVE NEGATIVE   Resp Syncytial Virus by PCR NEGATIVE NEGATIVE  Hepatic function panel   Collection Time: 09/25/23  4:00 AM  Result Value Ref Range   Total Protein 7.5 6.5 - 8.1  g/dL   Albumin 3.6 3.5 - 5.0 g/dL   AST 277 (H) 15 - 41  U/L   ALT 157 (H) 0 - 44 U/L   Alkaline Phosphatase 102 38 - 126 U/L   Total Bilirubin 2.1 (H) 0.0 - 1.2 mg/dL   Bilirubin, Direct 0.8 (H) 0.0 - 0.2 mg/dL   Indirect Bilirubin 1.3 (H) 0.3 - 0.9 mg/dL  Troponin I (High Sensitivity)   Collection Time: 09/25/23  4:00 AM  Result Value Ref Range   Troponin I (High Sensitivity) 14 <18 ng/L   CT Angio Chest PE W and/or Wo Contrast Result Date: 09/25/2023 CLINICAL DATA:  Syncope. EXAM: CT ANGIOGRAPHY CHEST WITH CONTRAST TECHNIQUE: Multidetector CT imaging of the chest was performed using the standard protocol during bolus administration of intravenous contrast. Multiplanar CT image reconstructions and MIPs were obtained to evaluate the vascular anatomy. RADIATION DOSE REDUCTION: This exam was performed according to the departmental dose-optimization program which includes automated exposure control, adjustment of the mA and/or kV according to patient size and/or use of iterative reconstruction technique. CONTRAST:  80mL OMNIPAQUE IOHEXOL 350 MG/ML SOLN COMPARISON:  None Available. FINDINGS: Cardiovascular: The heart size is normal. No substantial pericardial effusion. Mild atherosclerotic calcification is noted in the wall of the thoracic aorta. There is no filling defect within the opacified pulmonary arteries to suggest the presence of an acute pulmonary embolus. Mediastinum/Nodes: Multinodular thyroid enlargement identified including 2.3 cm left thyroid nodule on 20/4. Multiple rim calcified thyroid nodules evident. No mediastinal lymphadenopathy. There is no hilar lymphadenopathy. The esophagus has normal imaging features. There is no axillary lymphadenopathy. Lungs/Pleura: Clustered nodular ground-glass opacity identified in the right upper lobe (45/12). Diffuse subpleural reticulation noted in both lungs. No dense focal airspace consolidation. No pulmonary edema or pleural effusion. Upper Abdomen:  Visualized portion of the upper abdomen shows no acute findings. Musculoskeletal: No worrisome lytic or sclerotic osseous abnormality. Age indeterminate mild superior endplate compression deformity noted at L1. Review of the MIP images confirms the above findings. IMPRESSION: 1. No CT evidence for acute pulmonary embolus. 2. Clustered nodular ground-glass opacity in the right upper lobe, likely infectious/inflammatory. 3. Diffuse subpleural reticulation in both lungs, nonspecific. Imaging features can be seen in the setting of interstitial lung disease. 4. Multinodular thyroid enlargement including 2.3 cm left thyroid nodule. In the setting of significant comorbidities or limited life expectancy, no follow-up recommended (ref: J Am Coll Radiol. 2015 Feb;12(2): 143-50). 5. Age indeterminate mild superior endplate compression deformity at L1. Electronically Signed   By: Kennith Center M.D.   On: 09/25/2023 05:52   DG Chest Portable 1 View Result Date: 09/25/2023 CLINICAL DATA:  Pain and chest pressure. Epigastric pain. Recently sick with cold symptoms. EXAM: PORTABLE CHEST 1 VIEW COMPARISON:  07/10/2023 FINDINGS: Shallow inspiration. Heart size and pulmonary vascularity are normal for technique. Atelectasis or infiltration in the lung bases. No pleural effusion or pneumothorax. Mediastinal contours appear intact. Calcification of the aorta. Rounded calcification in the upper mediastinum is unchanged since prior study, likely representing a calcified lymph node. Degenerative changes in the spine. IMPRESSION: Slight infiltration or atelectasis in the lung bases is similar to prior study. Electronically Signed   By: Burman Nieves M.D.   On: 09/25/2023 02:15    EKG EKG Interpretation Date/Time:  Saturday September 25 2023 02:00:52 EDT Ventricular Rate:  74 PR Interval:  164 QRS Duration:  136 QT Interval:  390 QTC Calculation: 433 R Axis:   14  Text Interpretation: Sinus rhythm Probable left atrial  enlargement Left bundle branch block Confirmed by Nicanor Alcon, Oluwatimilehin Balfour ([id number]) on 09/25/2023 2:03:22 AM  Radiology  DG Chest Portable 1 View Result Date: 09/25/2023 CLINICAL DATA:  Pain and chest pressure. Epigastric pain. Recently sick with cold symptoms. EXAM: PORTABLE CHEST 1 VIEW COMPARISON:  07/10/2023 FINDINGS: Shallow inspiration. Heart size and pulmonary vascularity are normal for technique. Atelectasis or infiltration in the lung bases. No pleural effusion or pneumothorax. Mediastinal contours appear intact. Calcification of the aorta. Rounded calcification in the upper mediastinum is unchanged since prior study, likely representing a calcified lymph node. Degenerative changes in the spine. IMPRESSION: Slight infiltration or atelectasis in the lung bases is similar to prior study. Electronically Signed   By: Burman Nieves M.D.   On: 09/25/2023 02:15    Procedures Procedures    Medications Ordered in ED Medications  cefTRIAXone (ROCEPHIN) 1 g in sodium chloride 0.9 % 100 mL IVPB (has no administration in time range)  azithromycin (ZITHROMAX) 500 mg in sodium chloride 0.9 % 250 mL IVPB (has no administration in time range)  ketorolac (TORADOL) 30 MG/ML injection 15 mg (has no administration in time range)  acetaminophen (TYLENOL) tablet 1,000 mg (has no administration in time range)  amLODipine (NORVASC) tablet 5 mg (has no administration in time range)  ondansetron (ZOFRAN) injection 4 mg (4 mg Intravenous Given 09/25/23 0218)  alum & mag hydroxide-simeth (MAALOX/MYLANTA) 200-200-20 MG/5ML suspension 30 mL (30 mLs Oral Given 09/25/23 0218)  fentaNYL (SUBLIMAZE) injection 25 mcg (25 mcg Intravenous Given 09/25/23 0329)  iohexol (OMNIPAQUE) 350 MG/ML injection 80 mL (80 mLs Intravenous Contrast Given 09/25/23 0503)    ED Course/ Medical Decision Making/ A&P                                 Medical Decision Making Patient with right sided CP and cough and URI symptoms   Amount and/or  Complexity of Data Reviewed External Data Reviewed: notes.    Details: Previous notes reviewed  Labs: ordered.    Details: Positive influenza A, AST markedly elevated 606, ALT elevated 157, bilis slight elevated 1.3, troponin is normal 13 white count is normal 10.2, normal hemoglobin 13.6, normal platelets.   Radiology: ordered and independent interpretation performed.    Details: No PE by me on CTA, stomach is full  ECG/medicine tests: ordered and independent interpretation performed. Decision-making details documented in ED Course.  Risk OTC drugs. Prescription drug management. Decision regarding hospitalization.    Final Clinical Impression(s) / ED Diagnoses Final diagnoses:  Influenza A   The patient appears reasonably stabilized for admission considering the current resources, flow, and capabilities available in the ED at this time, and I doubt any other [facility] requiring further screening and/or treatment in the ED prior to admission.      Dayona Shaheen, MD 09/25/23 [phone]

## 2023-09-25 NOTE — ED Triage Notes (Signed)
 Pt BIBA from home, c/o chest pressure since 0030, pt endorses pain when epigastric region of abd is palpated also. Has been sick with recent cold, denies any N/V/D, or dizziness. HR 86, CBG 121, bp 148/80. A&Ox4.

## 2023-09-25 NOTE — Consult Note (Signed)
 Referring Provider: TH Primary Care Physician:  Fleet Contras, MD Primary Gastroenterologist: Dr. Randa Evens  Reason for Consultation: Abnormal LFTs  HPI: [name] is a 89 y.o. female with past medical history mentioned below presented to the hospital with chest pain.  Was found to have influenza A pneumonia.  Initial blood work showed normal CBC, negative troponins,Hepatic panel showed elevated LFTs with AST of 606, ALT 157, T. bili 2.1 with indirect hyperbilirubinemia, normal alkaline phosphatase.Marland Kitchen  GI is consulted for further evaluation.  CT chest PE protocol showed finding concerning for interstitial lung disease as well as possible infectious inflammatory changes in the right upper lobe.  Ultrasound right upper quadrant showed dilated CBD of 11 mm with 3 mm gallbladder polyp.  Patient seen and examined at bedside.  She is resting comfortably at this time.  History obtained with patient's daughter who was at bedside.  Patient started having acute episode of chest pain as well as left upper quadrant abdominal pain which was radiating to back.  She denied history of similar symptoms in the past.  Denies nausea or vomiting.  Denied diarrhea or constipation.  Past Medical History:  Diagnosis Date   Anemia    Breast cancer (HCC) 2002   Right   Cataract    Diabetes mellitus without complication (HCC)    Glaucoma    High cholesterol    Hypertension    Personal history of chemotherapy    Personal history of radiation therapy     Past Surgical History:  Procedure Laterality Date   ABDOMINAL HYSTERECTOMY     APPENDECTOMY     BREAST LUMPECTOMY Right 2001   EYE SURGERY      Prior to Admission medications   Medication Sig Start Date End Date Taking? Authorizing Provider  amLODipine (NORVASC) 5 MG tablet Take 2.5 mg by mouth daily.    [provider]  brimonidine (ALPHAGAN) 0.2 % ophthalmic solution Place 1 drop into the right eye 2 (two) times daily.    [provider]  Cyanocobalamin (VITAMIN B12 SL) Place 1 tablet under the tongue daily after lunch.    [provider]  diclofenac Sodium (VOLTAREN) 1 % GEL Apply 2 g topically 4 (four) times daily as needed (for pain- affected areas). 08/25/18   [provider]  dorzolamide-timolol (COSOPT) 2-0.5 % ophthalmic solution Place 1 drop into the right eye 2 (two) times daily. 10/27/18   [provider]  ezetimibe (ZETIA) 10 MG tablet Take 10 mg by mouth daily. 01/10/18   [provider]  gabapentin (NEURONTIN) 100 MG capsule Take 100 mg by mouth at bedtime.    [provider]  losartan (COZAAR) 100 MG tablet Take 100 mg by mouth daily. 11/16/17   [provider]  ROCKLATAN 0.02-0.005 % SOLN Place 1 drop into the right eye at bedtime.    [provider]    Scheduled Meds:  oseltamivir  75 mg Oral BID   Continuous Infusions:  [START ON 09/26/2023] azithromycin     [START ON 09/26/2023] cefTRIAXone (ROCEPHIN)  IV     PRN Meds:.acetaminophen **OR** acetaminophen, hydrALAZINE, ondansetron **OR** ondansetron (ZOFRAN) IV  Allergies as of 09/25/2023   (No Known Allergies)    Family History  Problem Relation Age of Onset   Hyperlipidemia Sister    Diabetes Brother    Hyperlipidemia Brother    Diabetes Maternal Grandmother    Stroke Maternal Grandfather    Diabetes Brother    Breast cancer Neg Hx  Social History   Socioeconomic History   Marital status: Married    Spouse name: Not on file   Number of children: Not on file   Years of education: Not on file   Highest education level: Not on file  Occupational History   Not on file  Tobacco Use   Smoking status: Never   Smokeless tobacco: Never  Vaping Use   Vaping status: Never Used  Substance and Sexual Activity   Alcohol use: No   Drug use: No   Sexual activity: Never  Other Topics Concern   Not on file  Social History Narrative   Not on file   Social Drivers of  Health   Financial Resource Strain: Not on file  Food Insecurity: Not on file  Transportation Needs: Not on file  Physical Activity: Not on file  Stress: Not on file  Social Connections: Not on file  Intimate Partner Violence: Not on file    Review of Systems: All negative except as stated above in HPI.  Physical Exam: Vital signs: Vitals:   09/25/23 0800 09/25/23 0815  BP: (!) 174/73 (!) 167/68  Pulse: 83 82  Resp: 20 15  Temp:    SpO2: 100% 99%     General:   Elderly patient, resting comfortably, not in acute distress Lungs: No visible respiratory distress Heart:  Regular rate and rhythm; no murmurs, clicks, rubs,  or gallops. Abdomen: Soft, nontender, nondistended, bowel sound present, no peritoneal signs Resting comfortably, mood and affect normal Rectal:  Deferred  GI:  Lab Results: Recent Labs    09/25/23 0149  WBC 10.2  HGB 13.6  HCT 42.1  PLT 213   BMET Recent Labs    09/25/23 0149  NA 137  K 4.5  CL 102  CO2 28  GLUCOSE 142*  BUN 27*  CREATININE 1.06*  CALCIUM 9.8   LFT Recent Labs    09/25/23 0400  PROT 7.5  ALBUMIN 3.6  AST 606*  ALT 157*  ALKPHOS 102  BILITOT 2.1*  BILIDIR 0.8*  IBILI 1.3*   PT/INR No results for input(s): "LABPROT", "INR" in the last 72 hours.   Studies/Results: US Abdomen Limited RUQ (LIVER/GB) Result Date: 09/25/2023 CLINICAL DATA:  Elevated LFTs. EXAM: ULTRASOUND ABDOMEN LIMITED RIGHT UPPER QUADRANT COMPARISON:  Chest CT performed earlier today. FINDINGS: Gallbladder: No definite gallstones. 3 mm echogenic focus along the non dependent mucosa towards the gallbladder fundus is probably a cholesterol polyp. No gallbladder wall thickening or pericholecystic fluid. Sonographer reports no sonographic Murphy sign. Common bile duct: Diameter: 11 mm Liver: No focal mass lesion identified. Mild coarsening of hepatic echotexture. Portal vein is patent on color Doppler imaging with normal direction of blood flow towards  the liver. Other: None. IMPRESSION: 1. Dilated common bile duct measuring 11 mm. MRCP or ERCP could be used to further evaluate as clinically warranted. 2. 3 mm echogenic focus along the non dependent mucosa towards the gallbladder fundus is probably a cholesterol polyp. No follow-up imaging recommended. 3. Mild coarsening of hepatic echotexture without focal mass lesion. Electronically Signed   By: Kennith Center M.D.   On: 09/25/2023 07:20   CT Angio Chest PE W and/or Wo Contrast Result Date: 09/25/2023 CLINICAL DATA:  Syncope. EXAM: CT ANGIOGRAPHY CHEST WITH CONTRAST TECHNIQUE: Multidetector CT imaging of the chest was performed using the standard protocol during bolus administration of intravenous contrast. Multiplanar CT image reconstructions and MIPs were obtained to evaluate the vascular anatomy. RADIATION DOSE REDUCTION: This exam was  performed according to the departmental dose-optimization program which includes automated exposure control, adjustment of the mA and/or kV according to patient size and/or use of iterative reconstruction technique. CONTRAST:  80mL OMNIPAQUE IOHEXOL 350 MG/ML SOLN COMPARISON:  None Available. FINDINGS: Cardiovascular: The heart size is normal. No substantial pericardial effusion. Mild atherosclerotic calcification is noted in the wall of the thoracic aorta. There is no filling defect within the opacified pulmonary arteries to suggest the presence of an acute pulmonary embolus. Mediastinum/Nodes: Multinodular thyroid enlargement identified including 2.3 cm left thyroid nodule on 20/4. Multiple rim calcified thyroid nodules evident. No mediastinal lymphadenopathy. There is no hilar lymphadenopathy. The esophagus has normal imaging features. There is no axillary lymphadenopathy. Lungs/Pleura: Clustered nodular ground-glass opacity identified in the right upper lobe (45/12). Diffuse subpleural reticulation noted in both lungs. No dense focal airspace consolidation. No pulmonary  edema or pleural effusion. Upper Abdomen: Visualized portion of the upper abdomen shows no acute findings. Musculoskeletal: No worrisome lytic or sclerotic osseous abnormality. Age indeterminate mild superior endplate compression deformity noted at L1. Review of the MIP images confirms the above findings. IMPRESSION: 1. No CT evidence for acute pulmonary embolus. 2. Clustered nodular ground-glass opacity in the right upper lobe, likely infectious/inflammatory. 3. Diffuse subpleural reticulation in both lungs, nonspecific. Imaging features can be seen in the setting of interstitial lung disease. 4. Multinodular thyroid enlargement including 2.3 cm left thyroid nodule. In the setting of significant comorbidities or limited life expectancy, no follow-up recommended (ref: J Am Coll Radiol. 2015 Feb;12(2): 143-50). 5. Age indeterminate mild superior endplate compression deformity at L1. Electronically Signed   By: Kennith Center M.D.   On: 09/25/2023 05:52   DG Chest Portable 1 View Result Date: 09/25/2023 CLINICAL DATA:  Pain and chest pressure. Epigastric pain. Recently sick with cold symptoms. EXAM: PORTABLE CHEST 1 VIEW COMPARISON:  07/10/2023 FINDINGS: Shallow inspiration. Heart size and pulmonary vascularity are normal for technique. Atelectasis or infiltration in the lung bases. No pleural effusion or pneumothorax. Mediastinal contours appear intact. Calcification of the aorta. Rounded calcification in the upper mediastinum is unchanged since prior study, likely representing a calcified lymph node. Degenerative changes in the spine. IMPRESSION: Slight infiltration or atelectasis in the lung bases is similar to prior study. Electronically Signed   By: Burman Nieves M.D.   On: 09/25/2023 02:15    Impression/Plan: -Abnormal LFTs with AST of 606, ALT 157, T. bili 2.1 with indirect hyperbilirubinemia, normal alkaline phosphatase.  Ultrasound showed dilated CBD of 13 mm. -Influenza and  pneumonia  Recommendations ------------------------- -Given acute episode of abdominal pain and abnormal LFTs with dilation of CBD, recommend MRI MRCP for further evaluation. -Repeat LFTs in the morning.  Check hepatitis panel.  Check INR.  GI will follow. -Okay to have diet from GI standpoint if no plan for MRI today from radiology department -Management options discussed with the family at bedside.    LOS: 0 days   Kathi Der  MD, FACP 09/25/2023, 8:57 AM  Contact #  [phone]

## 2023-09-26 DIAGNOSIS — J09X1 Influenza due to identified novel influenza A virus with pneumonia: Secondary | ICD-10-CM | POA: Diagnosis not present

## 2023-09-26 LAB — CBC
HCT: 39.4 % (ref 36.0–46.0)
Hemoglobin: 12.5 g/dL (ref 12.0–15.0)
MCH: 30.1 pg (ref 26.0–34.0)
MCHC: 31.7 g/dL (ref 30.0–36.0)
MCV: 94.9 fL (ref 80.0–100.0)
Platelets: 175 10*3/uL (ref 150–400)
RBC: 4.15 MIL/uL (ref 3.87–5.11)
RDW: 14.9 % (ref 11.5–15.5)
WBC: 7.9 10*3/uL (ref 4.0–10.5)
nRBC: 0 % (ref 0.0–0.2)

## 2023-09-26 LAB — COMPREHENSIVE METABOLIC PANEL
ALT: 234 U/L — ABNORMAL HIGH (ref 0–44)
AST: 270 U/L — ABNORMAL HIGH (ref 15–41)
Albumin: 3.3 g/dL — ABNORMAL LOW (ref 3.5–5.0)
Alkaline Phosphatase: 103 U/L (ref 38–126)
Anion gap: 9 (ref 5–15)
BUN: 31 mg/dL — ABNORMAL HIGH (ref 8–23)
CO2: 24 mmol/L (ref 22–32)
Calcium: 9.2 mg/dL (ref 8.9–10.3)
Chloride: 105 mmol/L (ref 98–111)
Creatinine, Ser: 0.95 mg/dL (ref 0.44–1.00)
GFR, Estimated: 56 mL/min — ABNORMAL LOW (ref >60)
Glucose, Bld: 103 mg/dL — ABNORMAL HIGH (ref 70–99)
Potassium: 4.4 mmol/L (ref 3.5–5.1)
Sodium: 138 mmol/L (ref 135–145)
Total Bilirubin: 1.5 mg/dL — ABNORMAL HIGH (ref 0.0–1.2)
Total Protein: 6.7 g/dL (ref 6.5–8.1)

## 2023-09-26 LAB — LIPASE, BLOOD: Lipase: 142 U/L — ABNORMAL HIGH (ref 11–51)

## 2023-09-26 NOTE — Plan of Care (Signed)
  Problem: Education: Goal: Knowledge of General Education information will improve Description: Including pain rating scale, medication(s)/side effects and non-pharmacologic comfort measures Outcome: Progressing   Problem: Clinical Measurements: Goal: Ability to maintain clinical measurements within normal limits will improve Outcome: Progressing   Problem: Clinical Measurements: Goal: Will remain free from infection Outcome: Progressing   Problem: Nutrition: Goal: Adequate nutrition will be maintained Outcome: Progressing   

## 2023-09-26 NOTE — Progress Notes (Signed)
 Progress Note   Patient: [name] WUJ:[id number] DOB: 1927/08/07 DOA: 09/25/2023     1 DOS: the patient was seen and examined on 09/26/2023   Brief hospital course: Per HPI  [name] is a 89 y.o. female with medical history significant of unspecified anemia, right breast cancer, history of chemo and radiotherapy, cataracts, glaucoma, type 2 diabetes, hyperlipidemia, hypertension who was brought to the emergency department due to having a chest pressure associated with epigastric pain that worsens with palpation of the area.  She has been sick with a cold for 5 days prior to presentation.  No prior episodes of these pain in the past.  No  emesis, diarrhea, constipation, melena or hematochezia.  She was found to have influenza A infection.  CT chest revealed interstitial lung disease.  Ultrasound of the right upper quadrant revealed dilated CBD of 11 mm with a 3 mm gallbladder polyp.  Gastroenterology was consulted and MRCP were ordered.  No acute findings.  Tiny polypoid lesion was not seen on MRI.  Colonic diverticulosis without diverticulitis were incidental findings.  Assessment and Plan:  Influenza A infection: Without evidence of hypoxia. Patient was not initiated on Tamiflu on admission.  Clinically looks stable.  Elevated LFTs in the context of acute influenza A infection.  Improved.  Elevated lipase levels on admission concerning for acute pancreatitis.  Follow-up lipase level was 142 today.  Patient did respond to conservative management with IV fluids.  MRCP with no significant biliary duct disease.  Gastroenterology consult appreciated.  Hepatitis panel were nonreactive.  Acute abdominal pain: Present on admission.  Likely due to acute pancreatitis.  Gallbladder ultrasound on admission had revealed dilated common bile duct.  MRI however shows resolution.  Hepatic echotexture shows some coarsening without local mass lesion.  2 mm polyp previously noted on ultrasound was  not seen on MRI.  Essential hypertension: Will continue with amlodipine 10 mg p.o. daily.  Low-salt diet advised.  Aortic atherosclerosis and hyperlipidemia: Continue with density may be a 10 mg p.o. daily.  Primary open-angle glaucoma: Patient will continue with Cosopt and Alphagan twice daily.  Ophthalmology follow-up as outpatient.     Subjective: Patient still has mild epigastric abdominal pain.  Denies any nausea or vomiting however.  Physical Exam: Vitals:   09/25/23 2213 09/26/23 0156 09/26/23 0253 09/26/23 0625  BP: (!) 136/54 (!) 87/51 115/60 135/68  Pulse: 69 63 63 72  Resp: 16 16  16   Temp: (!) 97.4 F (36.3 C) 97.7 F (36.5 C) 97.7 F (36.5 C) 98 F (36.7 C)  TempSrc:      SpO2: 97% 95% 93% 100%  Weight:      Height:       General: Patient is an elderly female who was seen laying comfortably in bed.  HEENT: Oral mucosa moist, dentition intact. Chest: Clinically adequate breath sounds without any added sounds. Cardiovascular: Regular S1-S2 with no murmur Abdomen: Soft nontender with no organomegaly Extremities: Without any pedal edema CNS shows no obvious focal deficit. Skin negative for any new rash. Data Reviewed:  MRI IMPRESSION: As interpreted by radiologist Motion degraded study. Intra and mild extrahepatic biliary duct dilatation without evidence of in obstructing lesion or choledocholithiasis.   Tiny mucosal polypoid lesion seen on ultrasound exam earlier today is not evident by MRI and may be obscured by motion artifact.   Tiny T2 hyperintensities in the liver and left kidney are much too small to characterize but are statistically most likely benign.  Colonic diverticulosis without diverticulitis within the visualized portions of the colon.  Family Communication: Discussed plan of care with patient's son who was at bedside.  Disposition: Status is: Inpatient Remains inpatient appropriate because: Pending improvement with patient symptoms   Planned Discharge Destination: Home    Time spent:  32 minutes  Author: Lilia Pro, MD 09/26/2023 12:48 PM  For on call review [email or web address].

## 2023-09-26 NOTE — Plan of Care (Signed)
 Pt states she feels better this shift. Problem: Education: Goal: Knowledge of General Education information will improve Description: Including pain rating scale, medication(s)/side effects and non-pharmacologic comfort measures Outcome: Progressing   Problem: Health Behavior/Discharge Planning: Goal: Ability to manage health-related needs will improve Outcome: Progressing   Problem: Clinical Measurements: Goal: Ability to maintain clinical measurements within normal limits will improve Outcome: Progressing Goal: Will remain free from infection Outcome: Progressing Goal: Diagnostic test results will improve Outcome: Progressing Goal: Respiratory complications will improve Outcome: Progressing Goal: Cardiovascular complication will be avoided Outcome: Progressing   Problem: Activity: Goal: Risk for activity intolerance will decrease Outcome: Progressing   Problem: Nutrition: Goal: Adequate nutrition will be maintained Outcome: Progressing   Problem: Coping: Goal: Level of anxiety will decrease Outcome: Progressing   Problem: Elimination: Goal: Will not experience complications related to bowel motility Outcome: Progressing Goal: Will not experience complications related to urinary retention Outcome: Progressing   Problem: Pain Managment: Goal: General experience of comfort will improve and/or be controlled Outcome: Progressing   Problem: Safety: Goal: Ability to remain free from injury will improve Outcome: Progressing   Problem: Skin Integrity: Goal: Risk for impaired skin integrity will decrease Outcome: Progressing   Problem: Activity: Goal: Ability to tolerate increased activity will improve Outcome: Progressing   Problem: Clinical Measurements: Goal: Ability to maintain a body temperature in the normal range will improve Outcome: Progressing   Problem: Respiratory: Goal: Ability to maintain adequate ventilation will improve Outcome: Progressing Goal:  Ability to maintain a clear airway will improve Outcome: Progressing

## 2023-09-26 NOTE — Plan of Care (Signed)

## 2023-09-26 NOTE — Progress Notes (Signed)
 [facility] Gastroenterology Progress Note  [name] 89 y.o. [birth year]  CC: Abdominal pain, abnormal LFTs, influenza A pneumonia   Subjective: Patient seen and examined at bedside.  Abdominal pain has improved.  She is complaining of intermittent trouble swallowing with solid food for many years.  ROS : afebrile, negative for chest pain   Objective: Vital signs in last 24 hours: Vitals:   09/26/23 0253 09/26/23 0625  BP: 115/60 135/68  Pulse: 63 72  Resp:  16  Temp: 97.7 F (36.5 C) 98 F (36.7 C)  SpO2: 93% 100%    Physical Exam: Elderly patient, resting comfortably in the recliner.  Not in acute distress.  Abdominal exam benign.  Lab Results: Recent Labs    09/25/23 0149 09/26/23 0701  NA 137 138  K 4.5 4.4  CL 102 105  CO2 28 24  GLUCOSE 142* 103*  BUN 27* 31*  CREATININE 1.06* 0.95  CALCIUM 9.8 9.2   Recent Labs    09/25/23 0400 09/26/23 0701  AST 606* 270*  ALT 157* 234*  ALKPHOS 102 103  BILITOT 2.1* 1.5*  PROT 7.5 6.7  ALBUMIN 3.6 3.3*   Recent Labs    09/25/23 0149 09/26/23 0701  WBC 10.2 7.9  NEUTROABS 7.8*  --   HGB 13.6 12.5  HCT 42.1 39.4  MCV 93.1 94.9  PLT 213 175   No results for input(s): "LABPROT", "INR" in the last 72 hours.    Assessment/Plan: -Abnormal LFTs with AST of 606, ALT 157, T. bili 2.1 with indirect hyperbilirubinemia, normal alkaline phosphatase.  Ultrasound showed dilated CBD of 13 mm. -Influenza and pneumonia -Chronic dysphagia   Recommendations ------------------------- -LFTs are improving.  MRI MRCP negative for choledocholithiasis or any biliary obstruction. -No further inpatient GI workup planned. -Recommend repeating LFTs in 1 to 2 weeks after discharge with PCP. -Recommend outpatient follow-up with GI in 2 months for further evaluation of chronic dysphagia which could be related to motility disorder related to age. -Advance diet as tolerated.  GI will sign off.  Call us back if needed.   Kathi Der MD, FACP 09/26/2023, 10:23 AM  Contact #  [phone]

## 2023-09-27 DIAGNOSIS — J09X1 Influenza due to identified novel influenza A virus with pneumonia: Secondary | ICD-10-CM | POA: Diagnosis not present

## 2023-09-27 LAB — CBC
HCT: 35.6 % — ABNORMAL LOW (ref 36.0–46.0)
Hemoglobin: 11.6 g/dL — ABNORMAL LOW (ref 12.0–15.0)
MCH: 30.1 pg (ref 26.0–34.0)
MCHC: 32.6 g/dL (ref 30.0–36.0)
MCV: 92.5 fL (ref 80.0–100.0)
Platelets: 188 10*3/uL (ref 150–400)
RBC: 3.85 MIL/uL — ABNORMAL LOW (ref 3.87–5.11)
RDW: 14.9 % (ref 11.5–15.5)
WBC: 10.1 10*3/uL (ref 4.0–10.5)
nRBC: 0 % (ref 0.0–0.2)

## 2023-09-27 LAB — COMPREHENSIVE METABOLIC PANEL
ALT: 136 U/L — ABNORMAL HIGH (ref 0–44)
AST: 110 U/L — ABNORMAL HIGH (ref 15–41)
Albumin: 2.9 g/dL — ABNORMAL LOW (ref 3.5–5.0)
Alkaline Phosphatase: 87 U/L (ref 38–126)
Anion gap: 7 (ref 5–15)
BUN: 25 mg/dL — ABNORMAL HIGH (ref 8–23)
CO2: 22 mmol/L (ref 22–32)
Calcium: 8.9 mg/dL (ref 8.9–10.3)
Chloride: 106 mmol/L (ref 98–111)
Creatinine, Ser: 1.07 mg/dL — ABNORMAL HIGH (ref 0.44–1.00)
GFR, Estimated: 48 mL/min — ABNORMAL LOW (ref >60)
Glucose, Bld: 119 mg/dL — ABNORMAL HIGH (ref 70–99)
Potassium: 4.2 mmol/L (ref 3.5–5.1)
Sodium: 135 mmol/L (ref 135–145)
Total Bilirubin: 1.2 mg/dL (ref 0.0–1.2)
Total Protein: 6.4 g/dL — ABNORMAL LOW (ref 6.5–8.1)

## 2023-09-27 LAB — LIPASE, BLOOD: Lipase: 47 U/L (ref 11–51)

## 2023-09-27 MED ORDER — AZITHROMYCIN 250 MG PO TABS: 500.0000 mg | ORAL_TABLET | Freq: Every day | ORAL | Status: DC

## 2023-09-27 NOTE — Discharge Summary (Signed)
 [name]:[id number] DOB: 01-Dec-1927 DOA: 09/25/2023  PCP: Fleet Contras, MD  Admit date: 09/25/2023 Discharge date: 09/27/2023  Time spent: 35 minutes  Recommendations for Outpatient Follow-up:  Pcp f/u 1 week check lfts then GI ([place]) f/u, need to schedule     Discharge Diagnoses:  Principal Problem:   Influenza A with pneumonia Active Problems:   Primary open angle glaucoma (POAG) of right eye, moderate stage   Primary open angle glaucoma (POAG) of left eye, severe stage   Acute gallstone pancreatitis   Abnormal LFTs (liver function tests)   Essential hypertension   Aortic atherosclerosis (HCC)   Abdominal pain   Hyperlipidemia   Discharge Condition: improving  Diet recommendation: heart healthy  Filed Weights   09/25/23 1700 09/27/23 0545  Weight: 59.4 kg 60.1 kg    History of present illness:  From admission h and p: [name] is a 89 y.o. female with medical history significant of unspecified anemia, right breast cancer, history of chemo and radiotherapy, cataracts, glaucoma, type 2 diabetes, hyperlipidemia, hypertension who was brought to the emergency department due to having a chest pressure since around 0030 associated with epigastric pain that worsens with palpation of the area.  She has been sick with a cold since 5 days ago.  No prior episodes of these pain in the past.  No  emesis, diarrhea, constipation, melena or hematochezia.  No flank pain, dysuria, frequency or hematuria.  She denied fever, chills, rhinorrhea, sore throat, wheezing or hemoptysis.  No palpitations, diaphoresis, PND, orthopnea or recent pitting edema of the lower extremities.   No polyuria, polydipsia, polyphagia or blurred vision.     Hospital Course:  Patient presents with abdominal pain. Found to be flu a positive, also elevated LFTs and lipase with dilated cbd on u/s. Mrcp performed showing intra and mild extrahpepatic biliary duct dilation w/o evidence of  obstructing lesion or choledocholithiasis. GI followed, advised nothing further given significant down-trend in LFTS and lipase. No clear explanation, possibly a stone that has since passed. Patient has mild RUQ pain currently, is tolerating diet. Advise pcp f/u within one week, check lfts then. Assuming resolution of LFT abnormalities and not further exacerbation of symptoms likely no further w/u needed. Hospital return precautions reviewed. GI does advise f/u with them, will also plan to follow up non-related dysphagia symptoms. Patient's influenza appears to be mild, essentially has no symptoms. BP here is soft, advise hold home antihypertensives pending PCP f/u. Offered PT evaluation prior to discharge, patient declined. D/c plan reviewed with daughter at bedside.   Procedures: MRCP   Consultations: GI  Discharge Exam: Vitals:   09/27/23 0503 09/27/23 1226  BP: 127/60 (!) 86/47  Pulse: 73 73  Resp: 15   Temp: 98.8 F (37.1 C) 98.7 F (37.1 C)  SpO2: 100% 94%    General: NAD Cardiovascular: RRR Respiratory: CTAB Abdomen: soft, mild tenderness upper, no rebound or guarding  Discharge Instructions   Discharge Instructions     Diet - low sodium heart healthy   Complete by: As directed    Increase activity slowly   Complete by: As directed       Allergies as of 09/27/2023   No Known Allergies      Medication List     PAUSE taking these medications    amLODipine 5 MG tablet Wait to take this until your doctor or other care provider tells you to start again. Commonly known as: NORVASC Take 2.5 mg by mouth daily.  losartan 100 MG tablet Wait to take this until your doctor or other care provider tells you to start again. Commonly known as: COZAAR Take 100 mg by mouth daily.       STOP taking these medications    valsartan 40 MG tablet Commonly known as: DIOVAN       TAKE these medications    brimonidine 0.2 % ophthalmic solution Commonly known as:  ALPHAGAN Place 1 drop into the right eye 2 (two) times daily.   diclofenac Sodium 1 % Gel Commonly known as: VOLTAREN Apply 2 g topically 4 (four) times daily as needed (for pain- affected areas).   dorzolamide-timolol 2-0.5 % ophthalmic solution Commonly known as: COSOPT Place 1 drop into the right eye 2 (two) times daily.   ezetimibe 10 MG tablet Commonly known as: ZETIA Take 10 mg by mouth daily.   gabapentin 100 MG capsule Commonly known as: NEURONTIN Take 100 mg by mouth at bedtime.   VITAMIN B12 SL Place 1 tablet under the tongue daily after lunch.       No Known Allergies  Follow-up Information     Fleet Contras, MD Follow up.   Specialty: Internal Medicine Contact information: [address] [address] [place] [address] [phone]         Kathi Der, MD Follow up.   Specialty: Gastroenterology Contact information: [address] [place] [address] [phone]                  The results of significant diagnostics from this hospitalization (including imaging, microbiology, ancillary and laboratory) are listed below for reference.    Significant Diagnostic Studies: MR ABDOMEN MRCP W WO CONTAST Result Date: 09/26/2023 CLINICAL DATA:  Abnormal LFTs.  Common bile duct dilatation. EXAM: MRI ABDOMEN WITHOUT AND WITH CONTRAST (INCLUDING MRCP) TECHNIQUE: Multiplanar multisequence MR imaging of the abdomen was performed both before and after the administration of intravenous contrast. Heavily T2-weighted images of the biliary and pancreatic ducts were obtained, and three-dimensional MRCP images were rendered by post processing. CONTRAST:  6mL GADAVIST GADOBUTROL 1 MMOL/ML IV SOLN COMPARISON:  Ultrasound abdomen earlier same day. Chest CTA earlier same day. FINDINGS: Lower chest: Unremarkable. Hepatobiliary: Evaluation liver is markedly motion degraded. Several tiny T2 hyperintensities are seen in the liver parenchyma, too small to  definitively characterize given motion artifact, but likely benign. No gallbladder wall thickening or evidence of pericholecystic fluid. No gallstones. The tiny polypoid focus seen along the mucosal surface of the gallbladder towards the fundus on recent ultrasound is not evident by MRI, but may be obscured by motion artifact. Intra and extrahepatic biliary duct dilatation evident. Common duct tapers from 13 mm diameter in the porta hepatis down to 7 mm diameter in the pancreatic head which is upper normal for patient age. MRCP imaging is markedly motion degraded in show some central flow artifact in the duct on coronal sequences, but no definite choledocholithiasis. Pancreas: No focal mass lesion. No dilatation of the main duct. No intraparenchymal cyst. No peripancreatic edema. Spleen:  No splenomegaly. No suspicious focal mass lesion. Adrenals/Urinary Tract: No adrenal nodule or mass. Right kidney unremarkable. Tiny T2 hyperintensities in the left kidney are much too small to characterize but are statistically most likely benign. No followup imaging is recommended. Stomach/Bowel: Stomach is unremarkable. No gastric wall thickening. No evidence of outlet obstruction. Duodenum is normally positioned as is the ligament of Treitz. No small bowel or colonic dilatation within the visualized abdomen. Diverticular disease is seen scattered  along the length of the visualized abdominal segments of the colon. Vascular/Lymphatic: No abdominal aortic aneurysm. No abdominal lymphadenopathy. Other:  No intraperitoneal free fluid. Musculoskeletal: No focal suspicious marrow enhancement within the visualized bony anatomy. IMPRESSION: Motion degraded study. Intra and mild extrahepatic biliary duct dilatation without evidence of in obstructing lesion or choledocholithiasis. Tiny mucosal polypoid lesion seen on ultrasound exam earlier today is not evident by MRI and may be obscured by motion artifact. Tiny T2 hyperintensities in the  liver and left kidney are much too small to characterize but are statistically most likely benign. Colonic diverticulosis without diverticulitis within the visualized portions of the colon. Electronically Signed   By: Kennith Center M.D.   On: 09/26/2023 09:21   MR 3D Recon At Scanner Result Date: 09/26/2023 CLINICAL DATA:  Abnormal LFTs.  Common bile duct dilatation. EXAM: MRI ABDOMEN WITHOUT AND WITH CONTRAST (INCLUDING MRCP) TECHNIQUE: Multiplanar multisequence MR imaging of the abdomen was performed both before and after the administration of intravenous contrast. Heavily T2-weighted images of the biliary and pancreatic ducts were obtained, and three-dimensional MRCP images were rendered by post processing. CONTRAST:  6mL GADAVIST GADOBUTROL 1 MMOL/ML IV SOLN COMPARISON:  Ultrasound abdomen earlier same day. Chest CTA earlier same day. FINDINGS: Lower chest: Unremarkable. Hepatobiliary: Evaluation liver is markedly motion degraded. Several tiny T2 hyperintensities are seen in the liver parenchyma, too small to definitively characterize given motion artifact, but likely benign. No gallbladder wall thickening or evidence of pericholecystic fluid. No gallstones. The tiny polypoid focus seen along the mucosal surface of the gallbladder towards the fundus on recent ultrasound is not evident by MRI, but may be obscured by motion artifact. Intra and extrahepatic biliary duct dilatation evident. Common duct tapers from 13 mm diameter in the porta hepatis down to 7 mm diameter in the pancreatic head which is upper normal for patient age. MRCP imaging is markedly motion degraded in show some central flow artifact in the duct on coronal sequences, but no definite choledocholithiasis. Pancreas: No focal mass lesion. No dilatation of the main duct. No intraparenchymal cyst. No peripancreatic edema. Spleen:  No splenomegaly. No suspicious focal mass lesion. Adrenals/Urinary Tract: No adrenal nodule or mass. Right kidney  unremarkable. Tiny T2 hyperintensities in the left kidney are much too small to characterize but are statistically most likely benign. No followup imaging is recommended. Stomach/Bowel: Stomach is unremarkable. No gastric wall thickening. No evidence of outlet obstruction. Duodenum is normally positioned as is the ligament of Treitz. No small bowel or colonic dilatation within the visualized abdomen. Diverticular disease is seen scattered along the length of the visualized abdominal segments of the colon. Vascular/Lymphatic: No abdominal aortic aneurysm. No abdominal lymphadenopathy. Other:  No intraperitoneal free fluid. Musculoskeletal: No focal suspicious marrow enhancement within the visualized bony anatomy. IMPRESSION: Motion degraded study. Intra and mild extrahepatic biliary duct dilatation without evidence of in obstructing lesion or choledocholithiasis. Tiny mucosal polypoid lesion seen on ultrasound exam earlier today is not evident by MRI and may be obscured by motion artifact. Tiny T2 hyperintensities in the liver and left kidney are much too small to characterize but are statistically most likely benign. Colonic diverticulosis without diverticulitis within the visualized portions of the colon. Electronically Signed   By: Kennith Center M.D.   On: 09/26/2023 09:21   US Abdomen Limited RUQ (LIVER/GB) Result Date: 09/25/2023 CLINICAL DATA:  Elevated LFTs. EXAM: ULTRASOUND ABDOMEN LIMITED RIGHT UPPER QUADRANT COMPARISON:  Chest CT performed earlier today. FINDINGS: Gallbladder: No definite gallstones. 3 mm  echogenic focus along the non dependent mucosa towards the gallbladder fundus is probably a cholesterol polyp. No gallbladder wall thickening or pericholecystic fluid. Sonographer reports no sonographic Murphy sign. Common bile duct: Diameter: 11 mm Liver: No focal mass lesion identified. Mild coarsening of hepatic echotexture. Portal vein is patent on color Doppler imaging with normal direction of  blood flow towards the liver. Other: None. IMPRESSION: 1. Dilated common bile duct measuring 11 mm. MRCP or ERCP could be used to further evaluate as clinically warranted. 2. 3 mm echogenic focus along the non dependent mucosa towards the gallbladder fundus is probably a cholesterol polyp. No follow-up imaging recommended. 3. Mild coarsening of hepatic echotexture without focal mass lesion. Electronically Signed   By: Kennith Center M.D.   On: 09/25/2023 07:20   CT Angio Chest PE W and/or Wo Contrast Result Date: 09/25/2023 CLINICAL DATA:  Syncope. EXAM: CT ANGIOGRAPHY CHEST WITH CONTRAST TECHNIQUE: Multidetector CT imaging of the chest was performed using the standard protocol during bolus administration of intravenous contrast. Multiplanar CT image reconstructions and MIPs were obtained to evaluate the vascular anatomy. RADIATION DOSE REDUCTION: This exam was performed according to the departmental dose-optimization program which includes automated exposure control, adjustment of the mA and/or kV according to patient size and/or use of iterative reconstruction technique. CONTRAST:  80mL OMNIPAQUE IOHEXOL 350 MG/ML SOLN COMPARISON:  None Available. FINDINGS: Cardiovascular: The heart size is normal. No substantial pericardial effusion. Mild atherosclerotic calcification is noted in the wall of the thoracic aorta. There is no filling defect within the opacified pulmonary arteries to suggest the presence of an acute pulmonary embolus. Mediastinum/Nodes: Multinodular thyroid enlargement identified including 2.3 cm left thyroid nodule on 20/4. Multiple rim calcified thyroid nodules evident. No mediastinal lymphadenopathy. There is no hilar lymphadenopathy. The esophagus has normal imaging features. There is no axillary lymphadenopathy. Lungs/Pleura: Clustered nodular ground-glass opacity identified in the right upper lobe (45/12). Diffuse subpleural reticulation noted in both lungs. No dense focal airspace  consolidation. No pulmonary edema or pleural effusion. Upper Abdomen: Visualized portion of the upper abdomen shows no acute findings. Musculoskeletal: No worrisome lytic or sclerotic osseous abnormality. Age indeterminate mild superior endplate compression deformity noted at L1. Review of the MIP images confirms the above findings. IMPRESSION: 1. No CT evidence for acute pulmonary embolus. 2. Clustered nodular ground-glass opacity in the right upper lobe, likely infectious/inflammatory. 3. Diffuse subpleural reticulation in both lungs, nonspecific. Imaging features can be seen in the setting of interstitial lung disease. 4. Multinodular thyroid enlargement including 2.3 cm left thyroid nodule. In the setting of significant comorbidities or limited life expectancy, no follow-up recommended (ref: J Am Coll Radiol. 2015 Feb;12(2): 143-50). 5. Age indeterminate mild superior endplate compression deformity at L1. Electronically Signed   By: Kennith Center M.D.   On: 09/25/2023 05:52   DG Chest Portable 1 View Result Date: 09/25/2023 CLINICAL DATA:  Pain and chest pressure. Epigastric pain. Recently sick with cold symptoms. EXAM: PORTABLE CHEST 1 VIEW COMPARISON:  07/10/2023 FINDINGS: Shallow inspiration. Heart size and pulmonary vascularity are normal for technique. Atelectasis or infiltration in the lung bases. No pleural effusion or pneumothorax. Mediastinal contours appear intact. Calcification of the aorta. Rounded calcification in the upper mediastinum is unchanged since prior study, likely representing a calcified lymph node. Degenerative changes in the spine. IMPRESSION: Slight infiltration or atelectasis in the lung bases is similar to prior study. Electronically Signed   By: Burman Nieves M.D.   On: 09/25/2023 02:15    Microbiology: Recent  Results (from the past 240 hours)  Resp panel by RT-PCR (RSV, Flu A&B, Covid) Anterior Nasal Swab     Status: Abnormal   Collection Time: 09/25/23  4:00 AM    Specimen: Anterior Nasal Swab  Result Value Ref Range Status   SARS Coronavirus 2 by RT PCR NEGATIVE NEGATIVE Final    Comment: (NOTE) SARS-CoV-2 target nucleic acids are NOT DETECTED.  The SARS-CoV-2 RNA is generally detectable in upper respiratory specimens during the acute phase of infection. The lowest concentration of SARS-CoV-2 viral copies this assay can detect is 138 copies/mL. A negative result does not preclude SARS-Cov-2 infection and should not be used as the sole basis for treatment or other patient management decisions. A negative result may occur with  improper specimen collection/handling, submission of specimen other than nasopharyngeal swab, presence of viral mutation(s) within the areas targeted by this assay, and inadequate number of viral copies(<138 copies/mL). A negative result must be combined with clinical observations, patient history, and epidemiological information. The expected result is Negative.  Fact Sheet for Patients:  BloggerCourse.com  Fact Sheet for Healthcare Providers:  SeriousBroker.it  This test is no t yet approved or cleared by the [place] FDA and  has been authorized for detection and/or diagnosis of SARS-CoV-2 by FDA under an Emergency Use Authorization (EUA). This EUA will remain  in effect (meaning this test can be used) for the duration of the COVID-19 declaration under Section 564(b)(1) of the Act, 21 U.S.C.section 360bbb-3(b)(1), unless the authorization is terminated  or revoked sooner.       Influenza A by PCR POSITIVE (A) NEGATIVE Final   Influenza B by PCR NEGATIVE NEGATIVE Final    Comment: (NOTE) The Xpert Xpress SARS-CoV-2/FLU/RSV plus assay is intended as an aid in the diagnosis of influenza from Nasopharyngeal swab specimens and should not be used as a sole basis for treatment. Nasal washings and aspirates are unacceptable for Xpert Xpress  SARS-CoV-2/FLU/RSV testing.  Fact Sheet for Patients: BloggerCourse.com  Fact Sheet for Healthcare Providers: SeriousBroker.it  This test is not yet approved or cleared by the [place] FDA and has been authorized for detection and/or diagnosis of SARS-CoV-2 by FDA under an Emergency Use Authorization (EUA). This EUA will remain in effect (meaning this test can be used) for the duration of the COVID-19 declaration under Section 564(b)(1) of the Act, 21 U.S.C. section 360bbb-3(b)(1), unless the authorization is terminated or revoked.     Resp Syncytial Virus by PCR NEGATIVE NEGATIVE Final    Comment: (NOTE) Fact Sheet for Patients: BloggerCourse.com  Fact Sheet for Healthcare Providers: SeriousBroker.it  This test is not yet approved or cleared by the [place] FDA and has been authorized for detection and/or diagnosis of SARS-CoV-2 by FDA under an Emergency Use Authorization (EUA). This EUA will remain in effect (meaning this test can be used) for the duration of the COVID-19 declaration under Section 564(b)(1) of the Act, 21 U.S.C. section 360bbb-3(b)(1), unless the authorization is terminated or revoked.  Performed at [facility], [address] [place], [place] [address]      Labs: Basic Metabolic Panel: Recent Labs  Lab 09/25/23 0149 09/26/23 0701 09/27/23 0501  NA 137 138 135  K 4.5 4.4 4.2  CL 102 105 106  CO2 28 24 22   GLUCOSE 142* 103* 119*  BUN 27* 31* 25*  CREATININE 1.06* 0.95 1.07*  CALCIUM 9.8 9.2 8.9   Liver Function Tests: Recent Labs  Lab 09/25/23 0400 09/26/23 0701 09/27/23  0501  AST 606* 270* 110*  ALT 157* 234* 136*  ALKPHOS 102 103 87  BILITOT 2.1* 1.5* 1.2  PROT 7.5 6.7 6.4*  ALBUMIN 3.6 3.3* 2.9*   Recent Labs  Lab 09/25/23 0635 09/26/23 0701 09/27/23 0501  LIPASE 3,578* 142* 47   No results for  input(s): "AMMONIA" in the last 168 hours. CBC: Recent Labs  Lab 09/25/23 0149 09/26/23 0701 09/27/23 0501  WBC 10.2 7.9 10.1  NEUTROABS 7.8*  --   --   HGB 13.6 12.5 11.6*  HCT 42.1 39.4 35.6*  MCV 93.1 94.9 92.5  PLT 213 175 188   Cardiac Enzymes: No results for input(s): "CKTOTAL", "CKMB", "CKMBINDEX", "TROPONINI" in the last 168 hours. BNP: BNP (last 3 results) No results for input(s): "BNP" in the last 8760 hours.  ProBNP (last 3 results) No results for input(s): "PROBNP" in the last 8760 hours.  CBG: No results for input(s): "GLUCAP" in the last 168 hours.     Signed:  Silvano Bilis MD.  [facility] 09/27/2023, 2:07 PM

## 2023-09-27 NOTE — Plan of Care (Signed)
  Problem: Education: Goal: Knowledge of General Education information will improve Description: Including pain rating scale, medication(s)/side effects and non-pharmacologic comfort measures 09/27/2023 1520 by Juleen Starr, RN Outcome: Adequate for Discharge 09/27/2023 1034 by Juleen Starr, RN Outcome: Progressing   Problem: Health Behavior/Discharge Planning: Goal: Ability to manage health-related needs will improve 09/27/2023 1520 by Juleen Starr, RN Outcome: Adequate for Discharge 09/27/2023 1034 by Juleen Starr, RN Outcome: Progressing   Problem: Clinical Measurements: Goal: Ability to maintain clinical measurements within normal limits will improve 09/27/2023 1520 by Juleen Starr, RN Outcome: Adequate for Discharge 09/27/2023 1034 by Juleen Starr, RN Outcome: Progressing Goal: Will remain free from infection 09/27/2023 1520 by Juleen Starr, RN Outcome: Adequate for Discharge 09/27/2023 1034 by Juleen Starr, RN Outcome: Progressing Goal: Diagnostic test results will improve 09/27/2023 1520 by Juleen Starr, RN Outcome: Adequate for Discharge 09/27/2023 1034 by Juleen Starr, RN Outcome: Progressing Goal: Respiratory complications will improve 09/27/2023 1520 by Juleen Starr, RN Outcome: Adequate for Discharge 09/27/2023 1034 by Juleen Starr, RN Outcome: Progressing Goal: Cardiovascular complication will be avoided 09/27/2023 1520 by Juleen Starr, RN Outcome: Adequate for Discharge 09/27/2023 1034 by Juleen Starr, RN Outcome: Progressing   Problem: Activity: Goal: Risk for activity intolerance will decrease 09/27/2023 1520 by Juleen Starr, RN Outcome: Adequate for Discharge 09/27/2023 1034 by Juleen Starr, RN Outcome: Progressing   Problem: Nutrition: Goal: Adequate nutrition will be maintained 09/27/2023 1520 by Juleen Starr, RN Outcome: Adequate for Discharge 09/27/2023 1034 by Juleen Starr, RN Outcome: Progressing   Problem: Coping: Goal: Level of anxiety will  decrease 09/27/2023 1520 by Juleen Starr, RN Outcome: Adequate for Discharge 09/27/2023 1034 by Juleen Starr, RN Outcome: Progressing   Problem: Elimination: Goal: Will not experience complications related to bowel motility 09/27/2023 1520 by Juleen Starr, RN Outcome: Adequate for Discharge 09/27/2023 1034 by Juleen Starr, RN Outcome: Progressing Goal: Will not experience complications related to urinary retention 09/27/2023 1520 by Juleen Starr, RN Outcome: Adequate for Discharge 09/27/2023 1034 by Juleen Starr, RN Outcome: Progressing   Problem: Pain Managment: Goal: General experience of comfort will improve and/or be controlled 09/27/2023 1520 by Juleen Starr, RN Outcome: Adequate for Discharge 09/27/2023 1034 by Juleen Starr, RN Outcome: Progressing   Problem: Safety: Goal: Ability to remain free from injury will improve 09/27/2023 1520 by Juleen Starr, RN Outcome: Adequate for Discharge 09/27/2023 1034 by Juleen Starr, RN Outcome: Progressing   Problem: Skin Integrity: Goal: Risk for impaired skin integrity will decrease 09/27/2023 1520 by Juleen Starr, RN Outcome: Adequate for Discharge 09/27/2023 1034 by Juleen Starr, RN Outcome: Progressing   Problem: Activity: Goal: Ability to tolerate increased activity will improve 09/27/2023 1520 by Juleen Starr, RN Outcome: Adequate for Discharge 09/27/2023 1034 by Juleen Starr, RN Outcome: Progressing   Problem: Clinical Measurements: Goal: Ability to maintain a body temperature in the normal range will improve 09/27/2023 1520 by Juleen Starr, RN Outcome: Adequate for Discharge 09/27/2023 1034 by Juleen Starr, RN Outcome: Progressing   Problem: Respiratory: Goal: Ability to maintain adequate ventilation will improve 09/27/2023 1520 by Juleen Starr, RN Outcome: Adequate for Discharge 09/27/2023 1034 by Juleen Starr, RN Outcome: Progressing Goal: Ability to maintain a clear airway will improve 09/27/2023 1520 by Juleen Starr,  RN Outcome: Adequate for Discharge 09/27/2023 1034 by Juleen Starr, RN Outcome: Progressing

## 2023-09-27 NOTE — Progress Notes (Signed)
   09/27/23 1417  TOC Brief Assessment  Insurance and Status Reviewed  Patient has primary care physician Yes  Home environment has been reviewed Single family home  Prior level of function: Modified independent  Prior/Current Home Services No current home services  Social Drivers of Health Review SDOH reviewed no interventions necessary  Readmission risk has been reviewed Yes  Transition of care needs no transition of care needs at this time

## 2023-09-27 NOTE — Plan of Care (Signed)
  Problem: Education: Goal: Knowledge of General Education information will improve Description: Including pain rating scale, medication(s)/side effects and non-pharmacologic comfort measures Outcome: Progressing   Problem: Clinical Measurements: Goal: Ability to maintain clinical measurements within normal limits will improve Outcome: Progressing   Problem: Clinical Measurements: Goal: Will remain free from infection Outcome: Progressing   Problem: Clinical Measurements: Goal: Diagnostic test results will improve Outcome: Progressing   Problem: Activity: Goal: Risk for activity intolerance will decrease Outcome: Progressing   

## 2023-10-11 ENCOUNTER — Other Ambulatory Visit: Payer: Self-pay | Admitting: Internal Medicine

## 2023-10-11 DIAGNOSIS — Z1231 Encounter for screening mammogram for malignant neoplasm of breast: Secondary | ICD-10-CM

## 2023-10-15 NOTE — Congregational Nurse Program (Signed)
  Dept: [phone]   Congregational Nurse Program Note  Date of Encounter: 09/05/2023  Past Medical History: Past Medical History:  Diagnosis Date   Anemia    Breast cancer (HCC) 2002   Right   Cataract    Diabetes mellitus without complication (HCC)    Glaucoma    High cholesterol    Hypertension    Personal history of chemotherapy    Personal history of radiation therapy     Encounter Details:  Today's Vitals   09/05/23 1309  BP: (!) 143/73  Pulse: 68  Resp: 16  Temp: 97.8 F (36.6 C)  TempSrc: Temporal  SpO2: 100%  PainSc: 0-No pain   There is no height or weight on file to calculate BMI.   CNP Questionnaire completed. Patient vitals, blood glucose and weight obtained. Patient wanted to follow up with congregational nurse after expressing she had a syncope episode a couple of days prior. Patient expressed she went to the hospital and was discharge home the same day. Patient expresses she has an upcoming appointment the following Tuesday. Nurse educated the patient on signs and symptoms of dehydration. Also discussed with patient about a Alert bracelet as patient lives home alone. Patient states her children check on her regularly. Nurse asked patient to follow up within a week. Spiritual care given.   Denny Peon, BSN, RN, CRRN,CMSRN

## 2023-10-15 NOTE — Congregational Nurse Program (Signed)
  Dept: [phone]   Congregational Nurse Program Note  Date of Encounter: 09/15/2023  Past Medical History: Past Medical History:  Diagnosis Date   Anemia    Breast cancer (HCC) 2002   Right   Cataract    Diabetes mellitus without complication (HCC)    Glaucoma    High cholesterol    Hypertension    Personal history of chemotherapy    Personal history of radiation therapy     Encounter Details:  Community Questionnaire - 09/15/23 1400       Questionnaire   Ask client: Do you give verbal consent for me to treat you today? Yes    Student Assistance N/A    Location Patient Served  [facility]    Encounter Setting CN site    Population Status Unknown    Insurance Medicare    Insurance/Financial Assistance Referral N/A    Medication N/A    Medical Provider Yes    Screening Referrals Made N/A    Medical Referrals Made N/A    Medical Appointment Completed N/A    CNP Interventions Advocate/Support;Spiritual Care;Educate;Counsel    Screenings CN Performed Blood Pressure;Blood Glucose;Weight    ED Visit Averted N/A    Life-Saving Intervention Made N/A             Today's Vitals   09/15/23 1400  BP: 113/64  Pulse: 70  Resp: 17  Temp: 98.3 F (36.8 C)  TempSrc: Temporal  SpO2: 95%  Weight: 130 lb (59 kg)  PainSc: 0-No pain   Body mass index is 28.13 kg/m.  Patient presented to clinic stating , "I'd like my vitals ,weight, and blood glucose checked." Reports no pain at this time. Patient engaged in discussion regarding education given related to grief and loss. Pt was provided educational material on grief and loss. Spiritual care offered in collaboration with congregational nurse, patient accepted emotional support and guidance. Patient verbalized understanding to education given.   Denny Peon, BSN, RN, CRRN,CMSRN

## 2023-10-29 ENCOUNTER — Ambulatory Visit

## 2023-11-05 ENCOUNTER — Ambulatory Visit
Admission: RE | Admit: 2023-11-05 | Discharge: 2023-11-05 | Disposition: A | Discharge status: Home / Self Care | Source: Ambulatory Visit | Attending: Internal Medicine | Admitting: Internal Medicine

## 2023-11-05 DIAGNOSIS — Z1231 Encounter for screening mammogram for malignant neoplasm of breast: Secondary | ICD-10-CM

## 2023-11-10 ENCOUNTER — Other Ambulatory Visit: Payer: Self-pay

## 2023-11-10 ENCOUNTER — Emergency Department (HOSPITAL_COMMUNITY)
Admission: EM | Admit: 2023-11-10 | Discharge: 2023-11-10 | Disposition: A | Discharge status: Home / Self Care | Attending: Emergency Medicine | Admitting: Emergency Medicine

## 2023-11-10 DIAGNOSIS — Z79899 Other long term (current) drug therapy: Secondary | ICD-10-CM | POA: Insufficient documentation

## 2023-11-10 DIAGNOSIS — I10 Essential (primary) hypertension: Secondary | ICD-10-CM | POA: Insufficient documentation

## 2023-11-10 LAB — CBC WITH DIFFERENTIAL/PLATELET
Abs Immature Granulocytes: 0.01 10*3/uL (ref 0.00–0.07)
Basophils Absolute: 0 10*3/uL (ref 0.0–0.1)
Basophils Relative: 0 %
Eosinophils Absolute: 0.1 10*3/uL (ref 0.0–0.5)
Eosinophils Relative: 1 %
HCT: 38.4 % (ref 36.0–46.0)
Hemoglobin: 12.5 g/dL (ref 12.0–15.0)
Immature Granulocytes: 0 %
Lymphocytes Relative: 33 %
Lymphs Abs: 2.3 10*3/uL (ref 0.7–4.0)
MCH: 30.4 pg (ref 26.0–34.0)
MCHC: 32.6 g/dL (ref 30.0–36.0)
MCV: 93.4 fL (ref 80.0–100.0)
Monocytes Absolute: 0.5 10*3/uL (ref 0.1–1.0)
Monocytes Relative: 7 %
Neutro Abs: 4.1 10*3/uL (ref 1.7–7.7)
Neutrophils Relative %: 59 %
Platelets: 233 10*3/uL (ref 150–400)
RBC: 4.11 MIL/uL (ref 3.87–5.11)
RDW: 15.3 % (ref 11.5–15.5)
WBC: 7 10*3/uL (ref 4.0–10.5)
nRBC: 0 % (ref 0.0–0.2)

## 2023-11-10 LAB — COMPREHENSIVE METABOLIC PANEL WITH GFR
ALT: 13 U/L (ref 0–44)
AST: 33 U/L (ref 15–41)
Albumin: 4 g/dL (ref 3.5–5.0)
Alkaline Phosphatase: 58 U/L (ref 38–126)
Anion gap: 10 (ref 5–15)
BUN: 10 mg/dL (ref 8–23)
CO2: 23 mmol/L (ref 22–32)
Calcium: 9.7 mg/dL (ref 8.9–10.3)
Chloride: 105 mmol/L (ref 98–111)
Creatinine, Ser: 0.66 mg/dL (ref 0.44–1.00)
GFR, Estimated: >60 mL/min (ref >60)
Glucose, Bld: 99 mg/dL (ref 70–99)
Potassium: 4.1 mmol/L (ref 3.5–5.1)
Sodium: 138 mmol/L (ref 135–145)
Total Bilirubin: 1.7 mg/dL — ABNORMAL HIGH (ref 0.0–1.2)
Total Protein: 7.8 g/dL (ref 6.5–8.1)

## 2023-11-10 MED ORDER — LOSARTAN POTASSIUM 50 MG PO TABS
50.0000 mg | ORAL_TABLET | Freq: Once | ORAL | Status: AC
  Administered 2023-11-10: 50 mg via ORAL
  Filled 2023-11-10: qty 1

## 2023-11-10 MED ORDER — AMLODIPINE BESYLATE 5 MG PO TABS
5.0000 mg | ORAL_TABLET | Freq: Once | ORAL | Status: DC
  Filled 2023-11-10: qty 1

## 2023-11-10 MED ORDER — LOSARTAN POTASSIUM 50 MG PO TABS
100.0000 mg | ORAL_TABLET | Freq: Once | ORAL | Status: DC
Start: 2023-11-10 — End: 2023-11-10
  Filled 2023-11-10: qty 2

## 2023-11-10 NOTE — Discharge Instructions (Signed)
 I recommend that you increase losartan  to 50 mg daily   You need to check your blood pressure regularly and see your doctor in a week to recheck your blood pressure  Return to ER if you have chest pain or shortness of breath or headache or vomiting or if your blood pressure is greater than 200

## 2023-11-10 NOTE — ED Triage Notes (Signed)
 Pt sent to the ER due to Hypertension. Pt take BP medication last night and only takes it at night. Her recent BP has been in the 200's systolic. Pt denies headaches. Pt does complain of lower back/buttocks pain. Chronic pain.

## 2023-11-10 NOTE — ED Provider Notes (Signed)
 [place] EMERGENCY DEPARTMENT AT [facility] Provider Note   CSN: [id number] Arrival date & time: 11/10/23  1719     History  Chief Complaint  Patient presents with   Hypertension    [name] is a 89 y.o. female history of hypertension, acute angle-closure glaucoma, gallstone pancreatitis here presenting with hypertension.  She states that every Wednesday she goes to [facility] and she has a nurse there that checks her vitals.  The nurse noticed that her blood pressure is 200/100.  Denies any chest pain or shortness of breath.  Patient of note was admitted for pancreatitis 2 months ago and was hypotensive and was taken off of losartan  and Norvasc .  Since then she did follow-up with her primary care doctor and was put on losartan  25 mg daily.  The history is provided by the patient.       Home Medications Prior to Admission medications   Medication Sig Start Date End Date Taking? Authorizing Provider  amLODipine  (NORVASC ) 5 MG tablet Take 2.5 mg by mouth daily. Patient not taking: Reported on 09/25/2023    [provider]  brimonidine  (ALPHAGAN ) 0.2 % ophthalmic solution Place 1 drop into the right eye 2 (two) times daily.    [provider]  Cyanocobalamin (VITAMIN B12 SL) Place 1 tablet under the tongue daily after lunch.    [provider]  diclofenac  Sodium (VOLTAREN ) 1 % GEL Apply 2 g topically 4 (four) times daily as needed (for pain- affected areas). Patient not taking: Reported on 09/25/2023 08/25/18   [provider]  dorzolamide -timolol  (COSOPT ) 2-0.5 % ophthalmic solution Place 1 drop into the right eye 2 (two) times daily. 10/27/18   [provider]  ezetimibe  (ZETIA ) 10 MG tablet Take 10 mg by mouth daily. 01/10/18   [provider]  gabapentin  (NEURONTIN ) 100 MG capsule Take 100 mg by mouth at bedtime.    [provider]  losartan  (COZAAR ) 100 MG tablet Take 100 mg by mouth daily.  11/16/17   [provider]      Allergies    Patient has no known allergies.    Review of Systems   Review of Systems  Cardiovascular:  Negative for chest pain.  Gastrointestinal:  Negative for abdominal pain.  All other systems reviewed and are negative.   Physical Exam Updated Vital Signs BP (!) 180/87 (BP Location: Left Arm)   Pulse 79   Temp 98 F (36.7 C) (Oral)   Resp 16   Ht 4\' 9"  (1.448 m)   Wt 60 kg   SpO2 100%   BMI 28.62 kg/m  Physical Exam Vitals and nursing note reviewed.  Constitutional:      Comments: Well-appearing in no acute distress  HENT:     Head: Normocephalic.     Nose: Nose normal.     Mouth/Throat:     Mouth: Mucous membranes are moist.  Eyes:     Extraocular Movements: Extraocular movements intact.     Pupils: Pupils are equal, round, and reactive to light.  Cardiovascular:     Rate and Rhythm: Normal rate and regular rhythm.     Pulses: Normal pulses.     Heart sounds: Normal heart sounds.  Pulmonary:     Effort: Pulmonary effort is normal.     Breath sounds: Normal breath sounds.  Abdominal:     General: Abdomen is flat.     Palpations: Abdomen is soft.  Musculoskeletal:  General: Normal range of motion.     Cervical back: Normal range of motion and neck supple.  Skin:    General: Skin is warm.     Capillary Refill: Capillary refill takes less than 2 seconds.  Neurological:     General: No focal deficit present.     Mental Status: She is oriented to person, place, and time.  Psychiatric:        Mood and Affect: Mood normal.        Behavior: Behavior normal.     ED Results / Procedures / Treatments   Labs (all labs ordered are listed, but only abnormal results are displayed) Labs Reviewed  CBC WITH DIFFERENTIAL/PLATELET  COMPREHENSIVE METABOLIC PANEL WITH GFR    EKG None  Radiology No results found.  Procedures Procedures    Medications Ordered in ED Medications  losartan  (COZAAR ) tablet 50 mg  (has no administration in time range)    ED Course/ Medical Decision Making/ A&P                                 Medical Decision Making [name] is a 89 y.o. female here presenting with hypertension.  Patient's blood pressure is 180 on arrival.  Patient was hypotensive several months ago and taken off of BP meds.  Since then she has been back on losartan  25 mg.  Patient has no chest pain or shortness of breath or headaches.  Will check CBC and CMP and will likely need to increase losartan  to 50 mg  7:23 PM Labs unremarkable.  Blood pressure down to 173/98 now.  Patient does have extra losartan  at home and I recommend that she takes losartan  50 mg daily.  She needs to follow-up with PCP in 1 to 2 weeks to recheck her blood pressure  Problems Addressed: Hypertension, unspecified type: chronic illness or injury  Amount and/or Complexity of Data Reviewed Labs: ordered. Decision-making details documented in ED Course.  Risk Prescription drug management.    Final Clinical Impression(s) / ED Diagnoses Final diagnoses:  None    Rx / DC Orders ED Discharge Orders     None         Dalene Duck, MD 11/10/23 1924

## 2023-11-10 NOTE — ED Notes (Signed)
Unsuccessful attempt at blood draw x 2.

## 2023-12-09 NOTE — Congregational Nurse Program (Signed)
  Dept: [phone]   Congregational Nurse Program Note  Date of Encounter: 12/08/2023  Past Medical History: Past Medical History:  Diagnosis Date   Anemia    Breast cancer (HCC) 2002   Right   Cataract    Diabetes mellitus without complication (HCC)    Glaucoma    High cholesterol    Hypertension    Personal history of chemotherapy    Personal history of radiation therapy     Encounter Details:  Community Questionnaire - 12/08/23 1344       Questionnaire   Ask client: Do you give verbal consent for me to treat you today? Yes    Student Assistance N/A    Location Patient Served  [name]    Encounter Setting CN site    Population Status Unknown    Insurance Medicare    Insurance/Financial Assistance Referral N/A    Medication N/A    Medical Provider Yes    Screening Referrals Made N/A    Medical Referrals Made Non-[facility] PCP/Clinic    Medical Appointment Completed Non-[facility] PCP/Clinic    CNP Interventions Advocate/Support;Spiritual Care;Educate;Counsel;Case Management    Screenings CN Performed Blood Pressure    ED Visit Averted Yes    Life-Saving Intervention Made Yes             Today's Vitals   12/08/23 1342  BP: (!) 169/72  Pulse: 78  Resp: 18  Temp: 97.9 F (36.6 C)  TempSrc: Temporal  SpO2: 98%   There is no height or weight on file to calculate BMI.  The patient was seen in the clinic today regarding an insect bite. The patient reported being bitten by an unknown insect approximately one week ago and developed a blister last night. The blister, which is intact and fluid filled, is accompanied by itching and swelling in her left foot, as well as redness around the affected area.  The nurse inquired if the patient had contacted her physician about these symptoms, to which the patient responded that she had not reached out to her PCP. Patient stated she wasn't sure if she needed to go to the urgent care, ER, or her physician office. She  requested assistance in scheduling an appointment. After several attempts, the nurse successfully reached the patient's primary care physician and scheduled an appointment for December 09, 2023, at 10:15 AM.   The patient was informed of the appointment details via phone, and she confirmed the date and time. The nurse will follow up with the patient to check on the outcome of her appointment.   Bernabe Brew, BSN, RN, CRRN,CMSRN

## 2023-12-09 NOTE — Congregational Nurse Program (Signed)
  Dept: [phone]   Congregational Nurse Program Note  Date of Encounter: 12/09/2023  Past Medical History: Past Medical History:  Diagnosis Date   Anemia    Breast cancer (HCC) 2002   Right   Cataract    Diabetes mellitus without complication (HCC)    Glaucoma    High cholesterol    Hypertension    Personal history of chemotherapy    Personal history of radiation therapy     Encounter Details:  Community Questionnaire - 12/09/23 1508       Questionnaire   Ask client: Do you give verbal consent for me to treat you today? Yes    Student Assistance N/A    Location Patient Served  [facility]    Encounter Setting CN site    Population Status Unknown    Insurance Medicare    Insurance/Financial Assistance Referral N/A    Medication N/A    Medical Provider Yes    Screening Referrals Made N/A    Medical Referrals Made Non-[facility] PCP/Clinic    Medical Appointment Completed Non-[facility] PCP/Clinic    CNP Interventions Advocate/Support;Spiritual Care;Educate;Counsel;Case Management    Screenings CN Performed N/A    ED Visit Averted Yes    Life-Saving Intervention Made Yes            patient called nurse to informed her about her appointment today. Patient stated she was thankful for congregational nurse assistance with making the appointment. Patient informed nurse her physician drew fluid out of blister, applied a dressing and prescribed an antibiotic. Patient stated she will  follow up with PCP next week and keep congregational nurse informed.   Bernabe Brew, BSN, RN, CRRN,CMSRN

## 2024-01-15 ENCOUNTER — Other Ambulatory Visit: Payer: Self-pay

## 2024-01-15 ENCOUNTER — Emergency Department (HOSPITAL_COMMUNITY): Admission: EM | Admit: 2024-01-15 | Discharge: 2024-01-15 | Disposition: A | Discharge status: Home / Self Care

## 2024-01-15 ENCOUNTER — Emergency Department (HOSPITAL_COMMUNITY)

## 2024-01-15 ENCOUNTER — Encounter (HOSPITAL_COMMUNITY): Payer: Self-pay | Admitting: Emergency Medicine

## 2024-01-15 DIAGNOSIS — R030 Elevated blood-pressure reading, without diagnosis of hypertension: Secondary | ICD-10-CM

## 2024-01-15 DIAGNOSIS — Z79899 Other long term (current) drug therapy: Secondary | ICD-10-CM | POA: Insufficient documentation

## 2024-01-15 DIAGNOSIS — E119 Type 2 diabetes mellitus without complications: Secondary | ICD-10-CM | POA: Diagnosis not present

## 2024-01-15 DIAGNOSIS — I10 Essential (primary) hypertension: Secondary | ICD-10-CM | POA: Diagnosis present

## 2024-01-15 LAB — CBC WITH DIFFERENTIAL/PLATELET
Abs Immature Granulocytes: 0.02 K/uL (ref 0.00–0.07)
Basophils Absolute: 0 K/uL (ref 0.0–0.1)
Basophils Relative: 1 %
Eosinophils Absolute: 0.1 K/uL (ref 0.0–0.5)
Eosinophils Relative: 1 %
HCT: 40.4 % (ref 36.0–46.0)
Hemoglobin: 12.8 g/dL (ref 12.0–15.0)
Immature Granulocytes: 0 %
Lymphocytes Relative: 27 %
Lymphs Abs: 2 K/uL (ref 0.7–4.0)
MCH: 30 pg (ref 26.0–34.0)
MCHC: 31.7 g/dL (ref 30.0–36.0)
MCV: 94.6 fL (ref 80.0–100.0)
Monocytes Absolute: 0.6 K/uL (ref 0.1–1.0)
Monocytes Relative: 9 %
Neutro Abs: 4.6 K/uL (ref 1.7–7.7)
Neutrophils Relative %: 62 %
Platelets: 205 K/uL (ref 150–400)
RBC: 4.27 MIL/uL (ref 3.87–5.11)
RDW: 15.4 % (ref 11.5–15.5)
WBC: 7.3 K/uL (ref 4.0–10.5)
nRBC: 0 % (ref 0.0–0.2)

## 2024-01-15 LAB — BASIC METABOLIC PANEL WITH GFR
Anion gap: 9 (ref 5–15)
BUN: 20 mg/dL (ref 8–23)
CO2: 23 mmol/L (ref 22–32)
Calcium: 9.3 mg/dL (ref 8.9–10.3)
Chloride: 106 mmol/L (ref 98–111)
Creatinine, Ser: 0.81 mg/dL (ref 0.44–1.00)
GFR, Estimated: >60 mL/min (ref >60)
Glucose, Bld: 107 mg/dL — ABNORMAL HIGH (ref 70–99)
Potassium: 4.2 mmol/L (ref 3.5–5.1)
Sodium: 138 mmol/L (ref 135–145)

## 2024-01-15 MED ORDER — LOSARTAN POTASSIUM 50 MG PO TABS
100.0000 mg | ORAL_TABLET | Freq: Once | ORAL | Status: AC
  Administered 2024-01-15: 100 mg via ORAL
  Filled 2024-01-15: qty 2

## 2024-01-15 MED ORDER — HYDROCHLOROTHIAZIDE 12.5 MG PO CAPS: 12.5000 mg | ORAL_CAPSULE | Freq: Every day | ORAL | 0 refills | Status: AC

## 2024-01-15 NOTE — ED Notes (Signed)
Patient has a blue top in lab

## 2024-01-15 NOTE — ED Notes (Addendum)
 Patient ambulated in the room and the hall way independently.   Patients vitals were updated before walk (@2015 ) and after walk (@2020 ).  RN made aware.

## 2024-01-15 NOTE — Discharge Instructions (Signed)
 Your workup today was reassuring.  You may start the low-dose HCTZ tomorrow.  Please follow-up with your doctor to have your blood test redrawn in a few weeks and have your blood pressure rechecked.  Please return to the ER for worsening symptoms.

## 2024-01-15 NOTE — ED Triage Notes (Signed)
 Pt reports feeling a little dizzy and her BP being elevated at home. Reports being seen multiple times for same. No falls. Alert and oriented.  Ambulatory to triage.

## 2024-01-15 NOTE — ED Provider Notes (Signed)
 [place] EMERGENCY DEPARTMENT AT [facility] Provider Note   CSN: [id number] Arrival date & time: 01/15/24  1910     Patient presents with: Hypertension   [name] is a 89 y.o. female.   89 year old female with past medical history of hypertension presenting to the emergency department today with concern for elevated blood pressure.  The patient states that she checked her blood pressure at home this evening and it was over 200 systolic.  She denies any room spinning dizziness but states that she had some mild lightheadedness and is moving slower than normal.  She came to the emergency department for this.  She is currently on 100 mg of losartan  daily.  Reports some mild lower extremity swelling that is more of a chronic issue for the patient.  She is here today for further evaluation regarding this due to ongoing symptoms.   Hypertension       Prior to Admission medications   Medication Sig Start Date End Date Taking? Authorizing Provider  hydrochlorothiazide  (MICROZIDE ) 12.5 MG capsule Take 1 capsule (12.5 mg total) by mouth daily. 01/15/24  Yes Ula Prentice SAUNDERS, MD  amLODipine  (NORVASC ) 5 MG tablet Take 2.5 mg by mouth daily. Patient not taking: Reported on 09/25/2023    [provider]  brimonidine  (ALPHAGAN ) 0.2 % ophthalmic solution Place 1 drop into the right eye 2 (two) times daily.    [provider]  Cyanocobalamin (VITAMIN B12 SL) Place 1 tablet under the tongue daily after lunch.    [provider]  diclofenac  Sodium (VOLTAREN ) 1 % GEL Apply 2 g topically 4 (four) times daily as needed (for pain- affected areas). Patient not taking: Reported on 09/25/2023 08/25/18   [provider]  dorzolamide -timolol  (COSOPT ) 2-0.5 % ophthalmic solution Place 1 drop into the right eye 2 (two) times daily. 10/27/18   [provider]  ezetimibe  (ZETIA ) 10 MG tablet Take 10 mg by mouth daily. 01/10/18   [provider]   gabapentin  (NEURONTIN ) 100 MG capsule Take 100 mg by mouth at bedtime.    [provider]  losartan  (COZAAR ) 100 MG tablet Take 100 mg by mouth daily. 11/16/17   [provider]    Allergies: Patient has no known allergies.    Review of Systems  Cardiovascular:  Positive for leg swelling.  Neurological:  Positive for light-headedness.  All other systems reviewed and are negative.   Updated Vital Signs BP (!) 199/85   Pulse 83   Temp 98.2 F (36.8 C) (Oral)   Resp 17   SpO2 100%   Physical Exam Vitals and nursing note reviewed.   Gen: NAD Eyes: PERRL, EOMI HEENT: no oropharyngeal swelling Neck: trachea midline Resp: clear to auscultation bilaterally Card: RRR, no murmurs, rubs, or gallops Abd: nontender, nondistended Extremities: no calf tenderness, trace edema around the ankles Vascular: 2+ radial pulses bilaterally, 2+ DP pulses bilaterally Skin: no rashes Psyc: acting appropriately   (all labs ordered are listed, but only abnormal results are displayed) Labs Reviewed  BASIC METABOLIC PANEL WITH GFR - Abnormal; Notable for the following components:      Result Value   Glucose, Bld 107 (*)    All other components within normal limits  CBC WITH DIFFERENTIAL/PLATELET    EKG: None  Radiology: DG Chest 2 View Result Date: 01/15/2024 CLINICAL DATA:  Hypertension EXAM: CHEST - 2 VIEW COMPARISON:  09/28/2023 FINDINGS: Cardiac shadow is stable. Aortic calcifications are again seen. Lungs are well aerated bilaterally.  No focal infiltrate or effusion is seen. Multiple stable thyroid  calcifications are noted. No bony abnormality is seen. IMPRESSION: No active cardiopulmonary disease. Electronically Signed   By: Oneil Devonshire M.D.   On: 01/15/2024 20:05     Procedures   Medications Ordered in the ED  losartan  (COZAAR ) tablet 100 mg (has no administration in time range)                                    Medical Decision Making 89 year old female  with past medical history of diabetes and hypertension presenting to the emergency department today with elevated blood pressure and some mild lightheadedness earlier today.  I will further evaluate the patient here with basic labs as well as an EKG and chest x-ray to evaluate for pulmonary edema or electrolyte abnormalities/endorgan dysfunction.  The patient's blood pressure on assessment here is in the 160s systolic.  Will hold off on any medications acutely.  I will reevaluate for ultimate disposition.  The patient is EKG shows a chronic left bundle branch block.  Labs are reassuring.  The patient's blood pressure is 186/96 on reassessment.  Will give the patient her evening dose of losartan .  There are no indications of any endorgan dysfunction here.  Patient been amatory without any dizziness.  Think that she is stable for discharge.  Will add HCTZ low-dose to her medication regimen to start in the morning.  She is discharged and encouraged to follow-up with her primary care provider.  Amount and/or Complexity of Data Reviewed Labs: ordered. Radiology: ordered.  Risk Prescription drug management.        Final diagnoses:  Elevated blood pressure reading    ED Discharge Orders          Ordered    hydrochlorothiazide  (MICROZIDE ) 12.5 MG capsule  Daily        01/15/24 2128               Ula Prentice SAUNDERS, MD 01/15/24 2128

## 2024-03-01 ENCOUNTER — Emergency Department (HOSPITAL_COMMUNITY)

## 2024-03-01 ENCOUNTER — Other Ambulatory Visit: Payer: Self-pay

## 2024-03-01 ENCOUNTER — Observation Stay (HOSPITAL_COMMUNITY)
Admission: EM | Admit: 2024-03-01 | Discharge: 2024-03-02 | Disposition: A | Discharge status: Home / Self Care | Attending: Emergency Medicine | Admitting: Emergency Medicine

## 2024-03-01 ENCOUNTER — Encounter (HOSPITAL_COMMUNITY): Payer: Self-pay

## 2024-03-01 DIAGNOSIS — Z87891 Personal history of nicotine dependence: Secondary | ICD-10-CM | POA: Insufficient documentation

## 2024-03-01 DIAGNOSIS — Z79899 Other long term (current) drug therapy: Secondary | ICD-10-CM | POA: Diagnosis not present

## 2024-03-01 DIAGNOSIS — E785 Hyperlipidemia, unspecified: Secondary | ICD-10-CM | POA: Diagnosis not present

## 2024-03-01 DIAGNOSIS — Z853 Personal history of malignant neoplasm of breast: Secondary | ICD-10-CM | POA: Insufficient documentation

## 2024-03-01 DIAGNOSIS — R55 Syncope and collapse: Principal | ICD-10-CM | POA: Insufficient documentation

## 2024-03-01 DIAGNOSIS — R918 Other nonspecific abnormal finding of lung field: Secondary | ICD-10-CM | POA: Insufficient documentation

## 2024-03-01 DIAGNOSIS — H401123 Primary open-angle glaucoma, left eye, severe stage: Secondary | ICD-10-CM | POA: Insufficient documentation

## 2024-03-01 DIAGNOSIS — R935 Abnormal findings on diagnostic imaging of other abdominal regions, including retroperitoneum: Secondary | ICD-10-CM | POA: Insufficient documentation

## 2024-03-01 DIAGNOSIS — I10 Essential (primary) hypertension: Secondary | ICD-10-CM | POA: Insufficient documentation

## 2024-03-01 DIAGNOSIS — Z1152 Encounter for screening for COVID-19: Secondary | ICD-10-CM | POA: Diagnosis not present

## 2024-03-01 DIAGNOSIS — H401112 Primary open-angle glaucoma, right eye, moderate stage: Secondary | ICD-10-CM | POA: Insufficient documentation

## 2024-03-01 DIAGNOSIS — R42 Dizziness and giddiness: Secondary | ICD-10-CM | POA: Diagnosis present

## 2024-03-01 LAB — COMPREHENSIVE METABOLIC PANEL WITH GFR
ALT: 14 U/L (ref 0–44)
AST: 33 U/L (ref 15–41)
Albumin: 3.1 g/dL — ABNORMAL LOW (ref 3.5–5.0)
Alkaline Phosphatase: 50 U/L (ref 38–126)
Anion gap: 7 (ref 5–15)
BUN: 21 mg/dL (ref 8–23)
CO2: 24 mmol/L (ref 22–32)
Calcium: 9.4 mg/dL (ref 8.9–10.3)
Chloride: 107 mmol/L (ref 98–111)
Creatinine, Ser: 1.14 mg/dL — ABNORMAL HIGH (ref 0.44–1.00)
GFR, Estimated: 45 mL/min — ABNORMAL LOW (ref >60)
Glucose, Bld: 164 mg/dL — ABNORMAL HIGH (ref 70–99)
Potassium: 3.8 mmol/L (ref 3.5–5.1)
Sodium: 138 mmol/L (ref 135–145)
Total Bilirubin: 1.1 mg/dL (ref 0.0–1.2)
Total Protein: 6.7 g/dL (ref 6.5–8.1)

## 2024-03-01 LAB — RESP PANEL BY RT-PCR (RSV, FLU A&B, COVID)  RVPGX2
Influenza A by PCR: NEGATIVE
Influenza B by PCR: NEGATIVE
Resp Syncytial Virus by PCR: NEGATIVE
SARS Coronavirus 2 by RT PCR: NEGATIVE

## 2024-03-01 LAB — CBC
HCT: 35.9 % — ABNORMAL LOW (ref 36.0–46.0)
Hemoglobin: 11.4 g/dL — ABNORMAL LOW (ref 12.0–15.0)
MCH: 30.2 pg (ref 26.0–34.0)
MCHC: 31.8 g/dL (ref 30.0–36.0)
MCV: 95 fL (ref 80.0–100.0)
Platelets: 244 K/uL (ref 150–400)
RBC: 3.78 MIL/uL — ABNORMAL LOW (ref 3.87–5.11)
RDW: 15.3 % (ref 11.5–15.5)
WBC: 6.4 K/uL (ref 4.0–10.5)
nRBC: 0 % (ref 0.0–0.2)

## 2024-03-01 LAB — D-DIMER, QUANTITATIVE: D-Dimer, Quant: 1.65 ug{FEU}/mL — ABNORMAL HIGH (ref 0.00–0.50)

## 2024-03-01 LAB — TROPONIN I (HIGH SENSITIVITY)
Troponin I (High Sensitivity): 6 ng/L (ref <18)
Troponin I (High Sensitivity): 7 ng/L (ref <18)

## 2024-03-01 LAB — LACTIC ACID, PLASMA
Lactic Acid, Venous: 1.9 mmol/L (ref 0.5–1.9)
Lactic Acid, Venous: 2.4 mmol/L (ref 0.5–1.9)

## 2024-03-01 LAB — CBG MONITORING, ED: Glucose-Capillary: 152 mg/dL — ABNORMAL HIGH (ref 70–99)

## 2024-03-01 LAB — PROCALCITONIN: Procalcitonin: <0.1 ng/mL

## 2024-03-01 MED ORDER — SODIUM CHLORIDE 0.9 % IV SOLN: 500.0000 mg | Freq: Once | INTRAVENOUS | Status: DC

## 2024-03-01 MED ORDER — BRIMONIDINE TARTRATE 0.2 % OP SOLN
1.0000 [drp] | Freq: Two times a day (BID) | OPHTHALMIC | Status: DC
  Administered 2024-03-01 – 2024-03-02 (×2): 1 [drp] via OPHTHALMIC
  Filled 2024-03-01: qty 5

## 2024-03-01 MED ORDER — IOHEXOL 350 MG/ML SOLN
75.0000 mL | Freq: Once | INTRAVENOUS | Status: AC | PRN
  Administered 2024-03-01: 75 mL via INTRAVENOUS

## 2024-03-01 MED ORDER — POLYETHYLENE GLYCOL 3350 17 G PO PACK: 17.0000 g | PACK | Freq: Every day | ORAL | Status: DC | PRN

## 2024-03-01 MED ORDER — ENOXAPARIN SODIUM 40 MG/0.4ML IJ SOSY
40.0000 mg | PREFILLED_SYRINGE | INTRAMUSCULAR | Status: DC
  Administered 2024-03-01: 40 mg via SUBCUTANEOUS
  Filled 2024-03-01: qty 0.4

## 2024-03-01 MED ORDER — EZETIMIBE 10 MG PO TABS
10.0000 mg | ORAL_TABLET | Freq: Every morning | ORAL | Status: DC
  Administered 2024-03-02: 10 mg via ORAL
  Filled 2024-03-01: qty 1

## 2024-03-01 MED ORDER — ACETAMINOPHEN 325 MG PO TABS: 650.0000 mg | ORAL_TABLET | Freq: Four times a day (QID) | ORAL | Status: DC | PRN

## 2024-03-01 MED ORDER — OLOPATADINE HCL 0.1 % OP SOLN: 1.0000 [drp] | OPHTHALMIC | Status: DC | PRN

## 2024-03-01 MED ORDER — SODIUM CHLORIDE 0.9 % IV SOLN
1.0000 g | Freq: Once | INTRAVENOUS | Status: AC
  Administered 2024-03-01: 1 g via INTRAVENOUS
  Filled 2024-03-01: qty 10

## 2024-03-01 MED ORDER — SODIUM CHLORIDE 0.9% FLUSH
3.0000 mL | Freq: Two times a day (BID) | INTRAVENOUS | Status: DC
  Administered 2024-03-01 – 2024-03-02 (×2): 3 mL via INTRAVENOUS

## 2024-03-01 MED ORDER — ACETAMINOPHEN 650 MG RE SUPP: 650.0000 mg | Freq: Four times a day (QID) | RECTAL | Status: DC | PRN

## 2024-03-01 MED ORDER — DOXYCYCLINE HYCLATE 100 MG PO TABS
100.0000 mg | ORAL_TABLET | Freq: Once | ORAL | Status: AC
  Administered 2024-03-01: 100 mg via ORAL
  Filled 2024-03-01: qty 1

## 2024-03-01 MED ORDER — NETARSUDIL-LATANOPROST 0.02-0.005 % OP SOLN: 1.0000 [drp] | Freq: Every day | OPHTHALMIC | Status: DC

## 2024-03-01 MED ORDER — HYDRALAZINE HCL 20 MG/ML IJ SOLN
5.0000 mg | INTRAMUSCULAR | Status: DC | PRN
  Administered 2024-03-01: 5 mg via INTRAVENOUS
  Filled 2024-03-01: qty 1

## 2024-03-01 MED ORDER — DORZOLAMIDE HCL-TIMOLOL MAL 2-0.5 % OP SOLN
1.0000 [drp] | Freq: Two times a day (BID) | OPHTHALMIC | Status: DC
  Administered 2024-03-01 – 2024-03-02 (×2): 1 [drp] via OPHTHALMIC
  Filled 2024-03-01: qty 10

## 2024-03-01 NOTE — ED Notes (Signed)
 Phlebotomy asked to stick

## 2024-03-01 NOTE — ED Provider Notes (Signed)
  Physical Exam  BP (!) 131/59   Pulse 68   Temp 97.7 F (36.5 C)   Resp 13   SpO2 100%   Physical Exam  Procedures  Procedures  ED Course / MDM    Medical Decision Making Amount and/or Complexity of Data Reviewed Labs: ordered. Radiology: ordered.  Risk Prescription drug management. Decision regarding hospitalization.   40F presenting after a syncopal episode, felt funny no other prodrome. Dimer positive, pending CTA, then plan to admit.  CTA: IMPRESSION:  1. No CT evidence of pulmonary artery embolus.  2. Faint cluster of ground-glass nodularity in the right upper lobe  concerning for atypical infiltrate.  3.  Aortic Atherosclerosis (ICD10-I70.0).    Hospitalist medicine consulted for admission, in the setting of a 89 year old with syncope with minimal prodrome, Dr. Seena accepting.    Jerrol Agent, MD 03/01/24 2352

## 2024-03-01 NOTE — ED Provider Notes (Signed)
 [place] EMERGENCY DEPARTMENT AT [facility] Provider Note   CSN: [id number] Arrival date & time: 03/01/24  1256     Patient presents with: No chief complaint on file.   [name] is a 89 y.o. female.   Pt is a 89 yo female with pmhx significant for HTN, DM, HLD, anemia and hx right breast cancer.  Pt complains of syncope.  Pt said she was eating at [employer] and said she felt funny.  She was planning on going to church for bible study after she ate.  Employees found her slumped over in the booth and EMS was called.  Pt was awake when EMS arrived.       Prior to Admission medications   Medication Sig Start Date End Date Taking? Authorizing Provider  amLODipine  (NORVASC ) 5 MG tablet Take 2.5 mg by mouth daily. Patient not taking: Reported on 09/25/2023    [provider]  brimonidine  (ALPHAGAN ) 0.2 % ophthalmic solution Place 1 drop into the right eye 2 (two) times daily.    [provider]  Cyanocobalamin (VITAMIN B12 SL) Place 1 tablet under the tongue daily after lunch.    [provider]  diclofenac  Sodium (VOLTAREN ) 1 % GEL Apply 2 g topically 4 (four) times daily as needed (for pain- affected areas). Patient not taking: Reported on 09/25/2023 08/25/18   [provider]  dorzolamide -timolol  (COSOPT ) 2-0.5 % ophthalmic solution Place 1 drop into the right eye 2 (two) times daily. 10/27/18   [provider]  ezetimibe  (ZETIA ) 10 MG tablet Take 10 mg by mouth daily. 01/10/18   [provider]  gabapentin  (NEURONTIN ) 100 MG capsule Take 100 mg by mouth at bedtime.    [provider]  hydrochlorothiazide  (MICROZIDE ) 12.5 MG capsule Take 1 capsule (12.5 mg total) by mouth daily. 01/15/24   Ula Prentice SAUNDERS, MD  losartan  (COZAAR ) 100 MG tablet Take 100 mg by mouth daily. 11/16/17   [provider]  ROCKLATAN  0.02-0.005 % SOLN Place 1 drop into the right eye daily.    [provider]     Allergies: Patient has no known allergies.    Review of Systems  Neurological:  Positive for syncope.  All other systems reviewed and are negative.   Updated Vital Signs BP (!) 131/59   Pulse 68   Temp 97.7 F (36.5 C)   Resp 13   SpO2 100%   Physical Exam Vitals and nursing note reviewed.  Constitutional:      Appearance: Normal appearance.  HENT:     Head: Normocephalic and atraumatic.     Right Ear: External ear normal.     Left Ear: External ear normal.     Nose: Nose normal.     Mouth/Throat:     Mouth: Mucous membranes are moist.     Pharynx: Oropharynx is clear.  Eyes:     Extraocular Movements: Extraocular movements intact.     Conjunctiva/sclera: Conjunctivae normal.     Pupils: Pupils are equal, round, and reactive to light.  Cardiovascular:     Rate and Rhythm: Normal rate and regular rhythm.     Pulses: Normal pulses.     Heart sounds: Normal heart sounds.  Pulmonary:     Effort: Pulmonary effort is normal.     Breath sounds: Normal breath sounds.  Abdominal:     General: Abdomen is flat. Bowel sounds are normal.     Palpations: Abdomen is soft.  Musculoskeletal:  General: Normal range of motion.     Cervical back: Normal range of motion and neck supple.  Skin:    General: Skin is warm.     Capillary Refill: Capillary refill takes less than 2 seconds.  Neurological:     General: No focal deficit present.     Mental Status: She is alert and oriented to person, place, and time.  Psychiatric:        Mood and Affect: Mood normal.        Behavior: Behavior normal.     (all labs ordered are listed, but only abnormal results are displayed) Labs Reviewed  CBC - Abnormal; Notable for the following components:      Result Value   RBC 3.78 (*)    Hemoglobin 11.4 (*)    HCT 35.9 (*)    All other components within normal limits  D-DIMER, QUANTITATIVE (NOT AT [facility]) - Abnormal; Notable for the following components:   D-Dimer, Quant 1.65 (*)     All other components within normal limits  COMPREHENSIVE METABOLIC PANEL WITH GFR - Abnormal; Notable for the following components:   Glucose, Bld 164 (*)    Creatinine, Ser 1.14 (*)    Albumin 3.1 (*)    GFR, Estimated 45 (*)    All other components within normal limits  CBG MONITORING, ED - Abnormal; Notable for the following components:   Glucose-Capillary 152 (*)    All other components within normal limits  CULTURE, BLOOD (ROUTINE X 2)  CULTURE, BLOOD (ROUTINE X 2)  RESP PANEL BY RT-PCR (RSV, FLU A&B, COVID)  [id number]  URINALYSIS, ROUTINE W REFLEX MICROSCOPIC  LACTIC ACID, PLASMA  LACTIC ACID, PLASMA  TROPONIN I (HIGH SENSITIVITY)  TROPONIN I (HIGH SENSITIVITY)    EKG: EKG Interpretation Date/Time:  Wednesday March 01 2024 12:59:40 EDT Ventricular Rate:  70 PR Interval:  162 QRS Duration:  130 QT Interval:  458 QTC Calculation: 494 R Axis:   -21  Text Interpretation: Normal sinus rhythm Left bundle branch block Abnormal ECG When compared with ECG of 25-Sep-2023 02:00, PREVIOUS ECG IS PRESENT No significant change since last tracing Confirmed by Dean Clarity [id number]) on 03/01/2024 1:56:25 PM  Radiology: CT Angio Chest PE W and/or Wo Contrast Result Date: 03/01/2024 CLINICAL DATA:  Concern for pulmonary embolism. EXAM: CT ANGIOGRAPHY CHEST WITH CONTRAST TECHNIQUE: Multidetector CT imaging of the chest was performed using the standard protocol during bolus administration of intravenous contrast. Multiplanar CT image reconstructions and MIPs were obtained to evaluate the vascular anatomy. RADIATION DOSE REDUCTION: This exam was performed according to the departmental dose-optimization program which includes automated exposure control, adjustment of the mA and/or kV according to patient size and/or use of iterative reconstruction technique. CONTRAST:  75mL OMNIPAQUE  IOHEXOL  350 MG/ML SOLN COMPARISON:  Chest CT dated 09/25/2023. FINDINGS: Cardiovascular: There is no cardiomegaly  or pericardial effusion. There is mild atherosclerotic calcification of the thoracic aorta. No aneurysmal dilatation or dissection. The origins of the great vessels of the aortic arch appear patent. No pulmonary artery embolus identified. Mediastinum/Nodes: No hilar or mediastinal adenopathy. The esophagus is grossly unremarkable. Partial thyroidectomy. Enlarged left thyroid  lobe with multiple nodules likely multinodular goiter. In the setting of significant comorbidities or limited life expectancy, no follow-up recommended (ref: J Am Coll Radiol. 2015 Feb;12(2): 143-50). No mediastinal fluid collection. Lungs/Pleura: Faint cluster of ground-glass nodularity in the right upper lobe (37/6) concerning for atypical infiltrate. This is relatively similar to prior CT. There is right lung base atelectasis. No  pleural effusion pneumothorax. The central airways are patent. Upper Abdomen: Colonic diverticulosis. Musculoskeletal: Osteopenia with degenerative changes of the spine. No acute osseous pathology. Review of the MIP images confirms the above findings. IMPRESSION: 1. No CT evidence of pulmonary artery embolus. 2. Faint cluster of ground-glass nodularity in the right upper lobe concerning for atypical infiltrate. 3.  Aortic Atherosclerosis (ICD10-I70.0). Electronically Signed   By: Vanetta Chou M.D.   On: 03/01/2024 17:16   DG Chest Portable 1 View Result Date: 03/01/2024 CLINICAL DATA:  syncope. EXAM: PORTABLE CHEST 1 VIEW COMPARISON:  01/15/2024. FINDINGS: Low lung volume. Bilateral lung fields are clear. Bilateral costophrenic angles are clear. Normal cardio-mediastinal silhouette. No acute osseous abnormalities. The soft tissues are within normal limits. IMPRESSION: No active disease. Electronically Signed   By: Ree Molt M.D.   On: 03/01/2024 15:49     Procedures   Medications Ordered in the ED  cefTRIAXone  (ROCEPHIN ) 1 g in sodium chloride  0.9 % 100 mL IVPB (has no administration in time range)   azithromycin  (ZITHROMAX ) 500 mg in sodium chloride  0.9 % 250 mL IVPB (has no administration in time range)  iohexol  (OMNIPAQUE ) 350 MG/ML injection 75 mL (75 mLs Intravenous Contrast Given 03/01/24 1644)                                    Medical Decision Making Amount and/or Complexity of Data Reviewed Labs: ordered. Radiology: ordered.  Risk Prescription drug management.   This patient presents to the ED for concern of syncope, this involves an extensive number of treatment options, and is a complaint that carries with it a high risk of complications and morbidity.  The differential diagnosis includes arrhythmia, cardiac event, pe, dehydration   Co morbidities that complicate the patient evaluation  HTN, DM, HLD, anemia and hx right breast cancer   Additional history obtained:  Additional history obtained from epic chart review External records from outside source obtained and reviewed including EMS report   Lab Tests:  I Ordered, and personally interpreted labs.  The pertinent results include:  cbc with hgb 11.4 (12.8 about a month ago); cmp with cr 1.14; trop nl; ddimer +   Imaging Studies ordered:  I ordered imaging studies including ct chest  I independently visualized and interpreted imaging which showed   No CT evidence of pulmonary artery embolus.  2. Faint cluster of ground-glass nodularity in the right upper lobe  concerning for atypical infiltrate.  3.  Aortic Atherosclerosis (ICD10-I70.0).   I agree with the radiologist interpretation   Cardiac Monitoring:  The patient was maintained on a cardiac monitor.  I personally viewed and interpreted the cardiac monitored which showed an underlying rhythm of: nsr   Medicines ordered and prescription drug management:  I ordered medication including rocephin /zithromax   for sx  Reevaluation of the patient after these medicines showed that the patient improved I have reviewed the patients home medicines and  have made adjustments as needed   Test Considered:  ct   Critical Interventions:  abx   Problem List / ED Course:  Syncope:  pt will need admission for high risk syncope ? CAP:  pt started on abx   Reevaluation:  After the interventions noted above, I reevaluated the patient and found that they have :improved   Social Determinants of Health:  Lives at home   Dispostion:  After consideration of the diagnostic results and the patients response  to treatment, I feel that the patent would benefit from admission.       Final diagnoses:  Syncope, unspecified syncope type    ED Discharge Orders     None          Dean Clarity, MD 03/01/24 [phone]

## 2024-03-01 NOTE — ED Triage Notes (Signed)
 Pt BIB [facility] from [employer], employees found her slumped over in the booth, unresponsive so called EMS. Upon EMS arrival pt waking up. Emesis noted on pts shirt.    113/54 CBG 270 Hr 60

## 2024-03-01 NOTE — ED Provider Triage Note (Signed)
 Emergency Medicine Provider Triage Evaluation Note  [name] , a 89 y.o. female  was evaluated in triage.  Pt complains of syncope.  Pt said she was eating at [employer] and said she felt funny.  She was planning on going to church for bible study after she ate.  Employees found her slumped over in the booth and EMS was called.  Pt was awake when EMS arrived. Review of Systems  Positive: syncope Negative: No cp, sob  Physical Exam  BP (!) 120/53 (BP Location: Right Arm)   Pulse 71   Temp 97.7 F (36.5 C)   Resp 16   SpO2 100%  Gen:   Awake, no distress   Resp:  Normal effort  MSK:   Moves extremities without difficulty  Other:    Medical Decision Making  Medically screening exam initiated at 1:10 PM.  Appropriate orders placed.  [name] was informed that the remainder of the evaluation will be completed by another provider, this initial triage assessment does not replace that evaluation, and the importance of remaining in the ED until their evaluation is complete.  Syncope orders placed.   Dean Clarity, MD 03/01/24 [phone]

## 2024-03-01 NOTE — ED Notes (Signed)
 Courtesy call given to [address]

## 2024-03-01 NOTE — H&P (Addendum)
 History and Physical   [name] FMW:[id number] DOB: 09/14/1927 DOA: 03/01/2024  PCP: Shelda Atlas, MD   Patient coming from: Home/restaurant  Chief Complaint: Syncope  HPI: [name] is a 89 y.o. female with medical history significant of hypertension, hyperlipidemia, glaucoma presenting after syncopal event.  Patient was eating breakfast at [employer] [place] and felt funny.  She was later found slumped over by staff and EMS was called.  By the time EMS arrived patient was awake and alert.  Patient had no other prodrome other than just feeling funny.  Occurred while sitting.  Denies fevers, chills, chest pain, shortness of breath, abdominal pain, constipation, diarrhea, nausea, vomiting.  Started heart  ED Course: Vital signs in the ED stable.  Lab workup included CMP with creatinine stable 1.14, albumin 3.1.  CBC with hemoglobin stable 11.4.  Troponin negative with repeat pending.  D-dimer 1.65.  Lactic acid pending.  Rester panel for flu COVID and RSV pending.  Urinalysis and blood cultures pending.  Patient had chest x-ray which showed no acute abnormality.  CTA PE study showed no evidence of PE but did show a area of groundglass nodularity suspicious for atypical infection.  Patient received ceftriaxone  and doxycycline  in the ED.  Review of Systems: As per HPI otherwise all other systems reviewed and are negative.  Past Medical History:  Diagnosis Date   Anemia    Breast cancer (HCC) 2002   Right   Cataract    Diabetes mellitus without complication (HCC)    Glaucoma    High cholesterol    Hypertension    Influenza A with pneumonia 09/25/2023   Personal history of chemotherapy    Personal history of radiation therapy     Past Surgical History:  Procedure Laterality Date   ABDOMINAL HYSTERECTOMY     APPENDECTOMY     BREAST LUMPECTOMY Right 2001   EYE SURGERY      Social History  reports that she has never smoked. She has never used smokeless  tobacco. She reports that she does not drink alcohol and does not use drugs.  No Known Allergies  Family History  Problem Relation Age of Onset   Hyperlipidemia Sister    Diabetes Brother    Hyperlipidemia Brother    Diabetes Maternal Grandmother    Stroke Maternal Grandfather    Diabetes Brother    Breast cancer Neg Hx   Reviewed on admission  Prior to Admission medications   Medication Sig Start Date End Date Taking? Authorizing Provider  brimonidine  (ALPHAGAN ) 0.2 % ophthalmic solution Place 1 drop into the right eye 2 (two) times daily.   Yes [provider]  diclofenac  Sodium (VOLTAREN ) 1 % GEL Apply 2 g topically 4 (four) times daily as needed (for pain- affected areas). 08/25/18  Yes [provider]  dorzolamide -timolol  (COSOPT ) 2-0.5 % ophthalmic solution Place 1 drop into the right eye 2 (two) times daily. 10/27/18  Yes [provider]  ezetimibe  (ZETIA ) 10 MG tablet Take 10 mg by mouth in the morning. 01/10/18  Yes [provider]  hydrochlorothiazide  (MICROZIDE ) 12.5 MG capsule Take 1 capsule (12.5 mg total) by mouth daily. Patient taking differently: Take 12.5 mg by mouth in the morning. 01/15/24  Yes Ula Prentice SAUNDERS, MD  losartan  (COZAAR ) 100 MG tablet Take 100 mg by mouth at bedtime. 11/16/17  Yes [provider]  Olopatadine  HCl (PATADAY  OP) Place 1 drop into both eyes as needed (dryness).   Yes [provider]  ROCKLATAN   0.02-0.005 % SOLN Place 1 drop into the right eye at bedtime.   Yes [provider]  gabapentin  (NEURONTIN ) 100 MG capsule Take 100 mg by mouth at bedtime. Patient not taking: Reported on 03/01/2024    [provider]    Physical Exam: Vitals:   03/01/24 1256 03/01/24 1500 03/01/24 1515  BP: (!) 120/53 (!) 131/59   Pulse: 71 65 68  Resp: 16 17 13   Temp: 97.7 F (36.5 C)    SpO2: 100% 100% 100%    Physical Exam Constitutional:      General: She is not in acute distress.     Appearance: Normal appearance.  HENT:     Head: Normocephalic and atraumatic.     Mouth/Throat:     Mouth: Mucous membranes are moist.     Pharynx: Oropharynx is clear.  Eyes:     Extraocular Movements: Extraocular movements intact.     Pupils: Pupils are equal, round, and reactive to light.  Cardiovascular:     Rate and Rhythm: Normal rate and regular rhythm.     Pulses: Normal pulses.     Heart sounds: Normal heart sounds.  Pulmonary:     Effort: Pulmonary effort is normal. No respiratory distress.     Breath sounds: Normal breath sounds.  Abdominal:     General: Bowel sounds are normal. There is no distension.     Palpations: Abdomen is soft.     Tenderness: There is no abdominal tenderness.  Musculoskeletal:        General: No swelling or deformity.  Skin:    General: Skin is warm and dry.  Neurological:     General: No focal deficit present.     Mental Status: Mental status is at baseline.    Labs on Admission: I have personally reviewed following labs and imaging studies  CBC: Recent Labs  Lab 03/01/24 1301  WBC 6.4  HGB 11.4*  HCT 35.9*  MCV 95.0  PLT 244    Basic Metabolic Panel: Recent Labs  Lab 03/01/24 1521  NA 138  K 3.8  CL 107  CO2 24  GLUCOSE 164*  BUN 21  CREATININE 1.14*  CALCIUM 9.4    GFR: CrCl cannot be calculated (Unknown ideal weight.).  Liver Function Tests: Recent Labs  Lab 03/01/24 1521  AST 33  ALT 14  ALKPHOS 50  BILITOT 1.1  PROT 6.7  ALBUMIN 3.1*    Urine analysis:    Component Value Date/Time   COLORURINE YELLOW 07/10/2023 1528   APPEARANCEUR CLEAR 07/10/2023 1528   LABSPEC 1.011 07/10/2023 1528   PHURINE 5.0 07/10/2023 1528   GLUCOSEU NEGATIVE 07/10/2023 1528   HGBUR NEGATIVE 07/10/2023 1528   BILIRUBINUR NEGATIVE 07/10/2023 1528   BILIRUBINUR negative 10/01/2016 1428   KETONESUR NEGATIVE 07/10/2023 1528   PROTEINUR NEGATIVE 07/10/2023 1528   UROBILINOGEN 4.0 (A) 10/01/2016 1428   UROBILINOGEN 0.2  01/27/2014 1549   NITRITE NEGATIVE 07/10/2023 1528   LEUKOCYTESUR NEGATIVE 07/10/2023 1528    Radiological Exams on Admission: CT Angio Chest PE W and/or Wo Contrast Result Date: 03/01/2024 CLINICAL DATA:  Concern for pulmonary embolism. EXAM: CT ANGIOGRAPHY CHEST WITH CONTRAST TECHNIQUE: Multidetector CT imaging of the chest was performed using the standard protocol during bolus administration of intravenous contrast. Multiplanar CT image reconstructions and MIPs were obtained to evaluate the vascular anatomy. RADIATION DOSE REDUCTION: This exam was performed according to the departmental dose-optimization program which includes automated exposure control, adjustment of the mA and/or kV according to  patient size and/or use of iterative reconstruction technique. CONTRAST:  75mL OMNIPAQUE  IOHEXOL  350 MG/ML SOLN COMPARISON:  Chest CT dated 09/25/2023. FINDINGS: Cardiovascular: There is no cardiomegaly or pericardial effusion. There is mild atherosclerotic calcification of the thoracic aorta. No aneurysmal dilatation or dissection. The origins of the great vessels of the aortic arch appear patent. No pulmonary artery embolus identified. Mediastinum/Nodes: No hilar or mediastinal adenopathy. The esophagus is grossly unremarkable. Partial thyroidectomy. Enlarged left thyroid  lobe with multiple nodules likely multinodular goiter. In the setting of significant comorbidities or limited life expectancy, no follow-up recommended (ref: J Am Coll Radiol. 2015 Feb;12(2): 143-50). No mediastinal fluid collection. Lungs/Pleura: Faint cluster of ground-glass nodularity in the right upper lobe (37/6) concerning for atypical infiltrate. This is relatively similar to prior CT. There is right lung base atelectasis. No pleural effusion pneumothorax. The central airways are patent. Upper Abdomen: Colonic diverticulosis. Musculoskeletal: Osteopenia with degenerative changes of the spine. No acute osseous pathology. Review of the  MIP images confirms the above findings. IMPRESSION: 1. No CT evidence of pulmonary artery embolus. 2. Faint cluster of ground-glass nodularity in the right upper lobe concerning for atypical infiltrate. 3.  Aortic Atherosclerosis (ICD10-I70.0). Electronically Signed   By: Vanetta Chou M.D.   On: 03/01/2024 17:16   DG Chest Portable 1 View Result Date: 03/01/2024 CLINICAL DATA:  syncope. EXAM: PORTABLE CHEST 1 VIEW COMPARISON:  01/15/2024. FINDINGS: Low lung volume. Bilateral lung fields are clear. Bilateral costophrenic angles are clear. Normal cardio-mediastinal silhouette. No acute osseous abnormalities. The soft tissues are within normal limits. IMPRESSION: No active disease. Electronically Signed   By: Ree Molt M.D.   On: 03/01/2024 15:49   EKG: Independently reviewed.  Sinus rhythm, left bundle branch block.  70 bpm.  Nonspecific T wave changes.  Similar to previous.  Assessment/Plan Principal Problem:   Syncope and collapse Active Problems:   Primary open angle glaucoma (POAG) of right eye, moderate stage   Primary open angle glaucoma (POAG) of left eye, severe stage   Essential hypertension   Hyperlipidemia   Syncope and collapse > Episode of syncope with only prodrome being feeling funny . > Found front of her by staff at this [facility] where she was eating, EMS was called and by the time EMS arrived she was alert. > High risk features of minimal prodrome and 89 year old patient.  Has had syncope before, last echo in our system in 2015. - Monitor on telemetry overnight - Echocardiogram - Orthostatic vital signs - Supportive care  Abnormal CT Rule out pneumonia > CTA done to rule out PE.  Was negative for PE.  Did show some faint groundglass nodularity concerning for atypical infection. > Did receive a dose of ceftriaxone  and doxycycline  in the ED.  However no other symptoms of pneumonia and white count is not elevated.  Possible viral etiology versus other. - Hold off on  further antibiotics - Check procalcitonin - Follow-up flu COVID and RSV panel  Hypertension - Has had amlodipine  and losartan  held recently per chart - Hold hydrochlorothiazide   Hyperlipidemia - Continue home Zetia   Glaucoma - Continue home eyedrops - Having trouble affording ROCKLATAN    History of breast cancer - Noted  DVT prophylaxis: Lovenox  Code Status:   DNR/DNI Family Communication:  Updated at bedside  Disposition Plan:   Patient is from:  Home  Anticipated DC to:  Home  Anticipated DC date:  1 to 2 days  Anticipated DC barriers: None  Consults called:  None Admission status:  Observation,  telemetry  Severity of Illness: The appropriate patient status for this patient is OBSERVATION. Observation status is judged to be reasonable and necessary in order to provide the required intensity of service to ensure the patient's safety. The patient's presenting symptoms, physical exam findings, and initial radiographic and laboratory data in the context of their medical condition is felt to place them at decreased risk for further clinical deterioration. Furthermore, it is anticipated that the patient will be medically stable for discharge from the hospital within 2 midnights of admission.    Marsa KATHEE Scurry MD [facility]  How to contact the [facility] Attending or Consulting provider 7A - 7P or covering provider during after hours 7P -7A, for this patient?   Check the care team in [facility] and look for a) attending/consulting [facility] provider listed and b) the [facility] team listed Log into [email or web address] and use [place]'s universal password to access. If you do not have the password, please contact the hospital operator. Locate the [facility] provider you are looking for under [facility] and page to a number that you can be directly reached. If you still have difficulty reaching the provider, please page the [facility] (Director on Call) for the Hospitalists listed on amion for  assistance.  03/01/2024, 6:08 PM

## 2024-03-02 ENCOUNTER — Inpatient Hospital Stay (HOSPITAL_BASED_OUTPATIENT_CLINIC_OR_DEPARTMENT_OTHER)
Admit: 2024-03-02 | Discharge: 2024-03-02 | Disposition: A | Discharge status: Home / Self Care | Attending: Cardiology | Admitting: Cardiology

## 2024-03-02 ENCOUNTER — Observation Stay (HOSPITAL_BASED_OUTPATIENT_CLINIC_OR_DEPARTMENT_OTHER)

## 2024-03-02 DIAGNOSIS — R55 Syncope and collapse: Secondary | ICD-10-CM | POA: Diagnosis not present

## 2024-03-02 LAB — COMPREHENSIVE METABOLIC PANEL WITH GFR
ALT: 13 U/L (ref 0–44)
AST: 32 U/L (ref 15–41)
Albumin: 3 g/dL — ABNORMAL LOW (ref 3.5–5.0)
Alkaline Phosphatase: 47 U/L (ref 38–126)
Anion gap: 8 (ref 5–15)
BUN: 15 mg/dL (ref 8–23)
CO2: 22 mmol/L (ref 22–32)
Calcium: 9.3 mg/dL (ref 8.9–10.3)
Chloride: 107 mmol/L (ref 98–111)
Creatinine, Ser: 0.98 mg/dL (ref 0.44–1.00)
GFR, Estimated: 54 mL/min — ABNORMAL LOW (ref >60)
Glucose, Bld: 99 mg/dL (ref 70–99)
Potassium: 3.8 mmol/L (ref 3.5–5.1)
Sodium: 137 mmol/L (ref 135–145)
Total Bilirubin: 2 mg/dL — ABNORMAL HIGH (ref 0.0–1.2)
Total Protein: 6.6 g/dL (ref 6.5–8.1)

## 2024-03-02 LAB — CBC
HCT: 35.3 % — ABNORMAL LOW (ref 36.0–46.0)
Hemoglobin: 11.8 g/dL — ABNORMAL LOW (ref 12.0–15.0)
MCH: 29.9 pg (ref 26.0–34.0)
MCHC: 33.4 g/dL (ref 30.0–36.0)
MCV: 89.4 fL (ref 80.0–100.0)
Platelets: 227 K/uL (ref 150–400)
RBC: 3.95 MIL/uL (ref 3.87–5.11)
RDW: 14.9 % (ref 11.5–15.5)
WBC: 8.9 K/uL (ref 4.0–10.5)
nRBC: 0 % (ref 0.0–0.2)

## 2024-03-02 LAB — ECHOCARDIOGRAM COMPLETE
AR max vel: 2.67 cm2
AV Area VTI: 2.65 cm2
AV Area mean vel: 2.48 cm2
AV Mean grad: 2 mmHg
AV Peak grad: 3.5 mmHg
Ao pk vel: 0.93 m/s
Area-P 1/2: 2.24 cm2
Height: 57 in
S' Lateral: 1.77 cm
Weight: 2155.22 [oz_av]

## 2024-03-02 LAB — PROCALCITONIN: Procalcitonin: <0.1 ng/mL

## 2024-03-02 LAB — LACTIC ACID, PLASMA: Lactic Acid, Venous: 1 mmol/L (ref 0.5–1.9)

## 2024-03-02 MED ORDER — SODIUM CHLORIDE 0.9 % IV BOLUS
500.0000 mL | Freq: Once | INTRAVENOUS | Status: AC
  Administered 2024-03-02: 500 mL via INTRAVENOUS

## 2024-03-02 NOTE — Plan of Care (Signed)
  Problem: Education: Goal: Knowledge of condition and prescribed therapy will improve Outcome: Completed/Met   Problem: Cardiac: Goal: Will achieve and/or maintain adequate cardiac output Outcome: Completed/Met   Problem: Physical Regulation: Goal: Complications related to the disease process, condition or treatment will be avoided or minimized Outcome: Completed/Met   Problem: Education: Goal: Knowledge of General Education information will improve Description: Including pain rating scale, medication(s)/side effects and non-pharmacologic comfort measures Outcome: Completed/Met   Problem: Health Behavior/Discharge Planning: Goal: Ability to manage health-related needs will improve Outcome: Completed/Met   Problem: Clinical Measurements: Goal: Ability to maintain clinical measurements within normal limits will improve Outcome: Completed/Met Goal: Will remain free from infection Outcome: Completed/Met Goal: Diagnostic test results will improve Outcome: Completed/Met Goal: Respiratory complications will improve Outcome: Completed/Met Goal: Cardiovascular complication will be avoided Outcome: Completed/Met   Problem: Activity: Goal: Risk for activity intolerance will decrease Outcome: Completed/Met   Problem: Nutrition: Goal: Adequate nutrition will be maintained Outcome: Completed/Met   Problem: Coping: Goal: Level of anxiety will decrease Outcome: Completed/Met   Problem: Elimination: Goal: Will not experience complications related to bowel motility Outcome: Completed/Met Goal: Will not experience complications related to urinary retention Outcome: Completed/Met   Problem: Pain Managment: Goal: General experience of comfort will improve and/or be controlled Outcome: Completed/Met   Problem: Safety: Goal: Ability to remain free from injury will improve Outcome: Completed/Met   Problem: Skin Integrity: Goal: Risk for impaired skin integrity will  decrease Outcome: Completed/Met

## 2024-03-02 NOTE — Evaluation (Signed)
 Physical Therapy Evaluation Patient Details Name: [name] MRN: [id number] DOB: 01/28/1928 Today's Date: 03/02/2024  History of Present Illness  Pt is 89 yo female who presents on 03/01/24 with syncopal episode at [place]. Had similar episode 1/25. PMH: DM2, HTN, HLD, gaucoma, breast cancer, flu with PNA 09/25/23  Clinical Impression  Pt admitted with above diagnosis. Pt reports similar episode in January. She denies recent falls but reports that she has had 4-5 episodes in which she was ambulating and suddenly had pain/ heaviness sensation from the waist down and could not step her feet and had to lower herself to the ground and wait 1-2 hours before being able to get back up. She also relays that she has slowly decreased her activity over past 3 years. At baseline pt ambulates independently without AD but has AD available. Pt came easily to standing but was unsteady once up and reaching for counter/ stable surfaces. Pt given RW for ambulation for safety and went 200' with CGA. BP stable before and after ambulation, 115/52. Recommend HHPT at d/c.  Pt currently with functional limitations due to the deficits listed below (see PT Problem List). Pt will benefit from acute skilled PT to increase their independence and safety with mobility to allow discharge.           If plan is discharge home, recommend the following: A little help with walking and/or transfers;Assistance with cooking/housework;Assist for transportation;Help with stairs or ramp for entrance   Can travel by private vehicle        Equipment Recommendations None recommended by PT  Recommendations for Other Services  OT consult    Functional Status Assessment Patient has had a recent decline in their functional status and demonstrates the ability to make significant improvements in function in a reasonable and predictable amount of time.     Precautions / Restrictions Precautions Precautions: Fall Recall of  Precautions/Restrictions: Intact Precaution/Restrictions Comments: pt relays that she has not had any recent falls but that she has had 3-4 episodes where she was walking and became unable to step her feet with a feeling of heaviness/pain from the waist down and she lowered herself to the floor and had to wait 1-2 hours before she could get back up Restrictions Weight Bearing Restrictions Per Provider Order: No      Mobility  Bed Mobility Overal bed mobility: Modified Independent             General bed mobility comments: increased time needed but no physical assist    Transfers Overall transfer level: Needs assistance Equipment used: Rolling walker (2 wheels), None Transfers: Sit to/from Stand Sit to Stand: Contact guard assist           General transfer comment: pt stood without assist but began reaching for stable surfaces and seemed mildly unsteady on her feet as she began to ambulate    Ambulation/Gait Ambulation/Gait assistance: Contact guard assist Gait Distance (Feet): 200 Feet Assistive device: Rolling walker (2 wheels) Gait Pattern/deviations: Step-through pattern Gait velocity: decreased Gait velocity interpretation: 1.31 - 2.62 ft/sec, indicative of limited community ambulator Pre-gait activities: wt shifting General Gait Details: gave pt RW for ambulation since she was unsteady, low step height, no overt LOB with RW.  Stairs            Wheelchair Mobility     Tilt Bed    Modified Rankin (Stroke Patients Only)       Balance Overall balance assessment: Needs assistance Sitting-balance support: No upper  extremity supported, Feet supported Sitting balance-Leahy Scale: Good     Standing balance support: No upper extremity supported Standing balance-Leahy Scale: Fair Standing balance comment: steady with static standing but balance limited with dynamic activity                             Pertinent Vitals/Pain Pain  Assessment Pain Assessment: No/denies pain    Home Living Family/patient expects to be discharged to:: Private residence Living Arrangements: Alone Available Help at Discharge: Family;Available PRN/intermittently Type of Home: House Home Access: Stairs to enter   Entrance Stairs-Number of Steps: 1   Home Layout: One level Home Equipment: Tub bench;Rolling Walker (2 wheels);Hand held shower head Additional Comments: children check on her regularly and have recently asked her to stop driving    Prior Function Prior Level of Function : Independent/Modified Independent;Driving             Mobility Comments: has AD at home but was ambulating independently at baseline ADLs Comments: independent. Does sit on bench to shower     Extremity/Trunk Assessment   Upper Extremity Assessment Upper Extremity Assessment: Overall WFL for tasks assessed    Lower Extremity Assessment Lower Extremity Assessment: Generalized weakness    Cervical / Trunk Assessment Cervical / Trunk Assessment: Other exceptions Cervical / Trunk Exceptions: forward head posture  Communication   Communication Communication: Impaired Factors Affecting Communication: Hearing impaired    Cognition Arousal: Alert Behavior During Therapy: WFL for tasks assessed/performed   PT - Cognitive impairments: No apparent impairments                         Following commands: Intact       Cueing Cueing Techniques: Verbal cues     General Comments General comments (skin integrity, edema, etc.): BP 115/52, stable before and after ambulation. HR 68-88 bpm, SPO2 100%    Exercises     Assessment/Plan    PT Assessment Patient needs continued PT services  PT Problem List Decreased strength;Decreased activity tolerance;Decreased balance;Decreased mobility       PT Treatment Interventions DME instruction;Gait training;Stair training;Functional mobility training;Therapeutic activities;Therapeutic  exercise;Balance training;Patient/family education    PT Goals (Current goals can be found in the Care Plan section)  Acute Rehab PT Goals Patient Stated Goal: return home PT Goal Formulation: With patient Time For Goal Achievement: 03/16/24 Potential to Achieve Goals: Good    Frequency Min 2X/week     Co-evaluation               AM-PAC PT 6 Clicks Mobility  Outcome Measure Help needed turning from your back to your side while in a flat bed without using bedrails?: None Help needed moving from lying on your back to sitting on the side of a flat bed without using bedrails?: None Help needed moving to and from a bed to a chair (including a wheelchair)?: None Help needed standing up from a chair using your arms (e.g., wheelchair or bedside chair)?: A Little Help needed to walk in hospital room?: A Little Help needed climbing 3-5 steps with a railing? : A Little 6 Click Score: 21    End of Session Equipment Utilized During Treatment: Gait belt Activity Tolerance: Patient tolerated treatment well Patient left: in bed;with call bell/phone within reach;with bed alarm set Nurse Communication: Mobility status PT Visit Diagnosis: Unsteadiness on feet (R26.81);History of falling (Z91.81);Difficulty in walking, not elsewhere classified (R26.2)  Time: 1133-1202 PT Time Calculation (min) (ACUTE ONLY): 29 min   Charges:   PT Evaluation $PT Eval Moderate Complexity: 1 Mod PT Treatments $Gait Training: 8-22 mins PT General Charges $$ ACUTE PT VISIT: 1 Visit         Richerd Lipoma, PT  Acute Rehab Services Secure chat preferred Office [phone]   Richerd CROME Mayrani Khamis 03/02/2024, 1:55 PM

## 2024-03-02 NOTE — Care Management Obs Status (Signed)
 MEDICARE OBSERVATION STATUS NOTIFICATION   Patient Details  Name: [name] MRN: [id number] Date of Birth: 11-Jan-1928   Medicare Observation Status Notification Given:  Yes  Obs notice signed and copy given   Claretta Deed 03/02/2024, 3:42 PM

## 2024-03-02 NOTE — Discharge Summary (Signed)
 PATIENT DETAILS Name: [name] Age: 89 y.o. Sex: female Date of Birth: Oct 14, 1927 MRN: [id number]. Admitting Physician: Sarah KATHEE Scurry, MD ERE:Jcalzmz, Aliene, MD  Admit Date: 03/01/2024 Discharge date: 03/02/2024  Recommendations for Outpatient Follow-up:  Follow up with PCP in 1-2 weeks Please obtain CMP/CBC in one week Please ensure follow-up with cardiology  Admitted From:  Home  Disposition: Home   Discharge Condition: good  CODE STATUS:   Code Status: Limited: Do not attempt resuscitation (DNR) -DNR-LIMITED -Do Not Intubate/DNI    Diet recommendation:  Diet Order             Diet - low sodium heart healthy           Diet regular Room service appropriate? Yes; Fluid consistency: Thin  Diet effective now                    Brief Summary: 89 year old with history of HTN, HLD, glaucoma-who presented to the hospital following a syncopal episode.  Patient was found unresponsive/slumped over at the local [place].  Per patient-she felt uneasy/lightheaded prior to this episode.  No recent history of nausea, vomiting or diarrhea.  Claims that she had a similar episode in January of this year.  Per daughter-this is probably her third episode of unexplained syncope.  Significant studies 8/27>> CTA chest: No PE, faint cluster of groundglass opacity in the right upper lobe 8/28>> echo:EF 55-60%  Brief Hospital Course: Syncope With prodrome-feeling of lightheadedness/uneasy.  This occurred in a sitting position. This is likely a 2nd or 3rd episode Telemetry without arrhythmias Echo stable Have discussed with cardiology-outpatient cardiac monitor being arranged with a quick cardiology follow-up. Patient aware of driving restrictions.  HTN BP stable-borderline orthostatic-but essentially asymptomatic Continue to hold HCTZ Losartan  already on hold prior to this hospitalization Follow-up with PCP and decide if patient needs to be on blood pressure  medications.  HLD Zetia   Glaucoma Resume eyedrops.  Discharge Diagnoses:  Principal Problem:   Syncope and collapse Active Problems:   Primary open angle glaucoma (POAG) of right eye, moderate stage   Primary open angle glaucoma (POAG) of left eye, severe stage   Essential hypertension   Hyperlipidemia   Discharge Instructions:  Activity:  As tolerated with Full fall precautions use walker/cane & assistance as needed  Discharge Instructions     Diet - low sodium heart healthy   Complete by: As directed    Discharge instructions   Complete by: As directed    Follow with Primary MD  Sarah Aliene, MD in 1-2 weeks  You have unexplained loss of consciousness/syncope-please do not drive/operate heavy machinery or participate in activities at height unless you are cleared to do so by your primary care practitioner.  Please get a complete blood count and chemistry panel checked by your Primary MD at your next visit, and again as instructed by your Primary MD.  Get Medicines reviewed and adjusted: Please take all your medications with you for your next visit with your Primary MD  Laboratory/radiological data: Please request your Primary MD to go over all hospital tests and procedure/radiological results at the follow up, please ask your Primary MD to get all Hospital records sent to his/her office.  In some cases, they will be blood work, cultures and biopsy results pending at the time of your discharge. Please request that your primary care M.D. follows up on these results.  Also Note the following: If you experience worsening of your admission symptoms, develop  shortness of breath, life threatening emergency, suicidal or homicidal thoughts you must seek medical attention immediately by calling 911 or calling your MD immediately  if symptoms less severe.  You must read complete instructions/literature along with all the possible adverse reactions/side effects for all the  Medicines you take and that have been prescribed to you. Take any new Medicines after you have completely understood and accpet all the possible adverse reactions/side effects.   Do not drive when taking Pain medications or sleeping medications (Benzodaizepines)  Do not take more than prescribed Pain, Sleep and Anxiety Medications. It is not advisable to combine anxiety,sleep and pain medications without talking with your primary care practitioner  Special Instructions: If you have smoked or chewed Tobacco  in the last 2 yrs please stop smoking, stop any regular Alcohol  and or any Recreational drug use.  Wear Seat belts while driving.  Please note: You were cared for by a hospitalist during your hospital stay. Once you are discharged, your primary care physician will handle any further medical issues. Please note that NO REFILLS for any discharge medications will be authorized once you are discharged, as it is imperative that you return to your primary care physician (or establish a relationship with a primary care physician if you do not have one) for your post hospital discharge needs so that they can reassess your need for medications and monitor your lab values.   Increase activity slowly   Complete by: As directed       Allergies as of 03/02/2024   No Known Allergies      Medication List     PAUSE taking these medications    hydrochlorothiazide  12.5 MG capsule Wait to take this until your doctor or other care provider tells you to start again. Commonly known as: MICROZIDE  Take 1 capsule (12.5 mg total) by mouth daily. What changed: when to take this   losartan  100 MG tablet Wait to take this until your doctor or other care provider tells you to start again. Commonly known as: COZAAR  Take 100 mg by mouth at bedtime.       STOP taking these medications    gabapentin  100 MG capsule Commonly known as: NEURONTIN        TAKE these medications    brimonidine  0.2 %  ophthalmic solution Commonly known as: ALPHAGAN  Place 1 drop into the right eye 2 (two) times daily.   diclofenac  Sodium 1 % Gel Commonly known as: VOLTAREN  Apply 2 g topically 4 (four) times daily as needed (for pain- affected areas).   dorzolamide -timolol  2-0.5 % ophthalmic solution Commonly known as: COSOPT  Place 1 drop into the right eye 2 (two) times daily.   ezetimibe  10 MG tablet Commonly known as: ZETIA  Take 10 mg by mouth in the morning.   PATADAY  OP Place 1 drop into both eyes as needed (dryness).   Rocklatan  0.02-0.005 % Soln Generic drug: Netarsudil -Latanoprost  Place 1 drop into the right eye at bedtime.        Follow-up Information     Sarah Atlas, MD. Schedule an appointment as soon as possible for a visit in 1 week(s).   Specialty: Internal Medicine Contact information: [address] [place] [address] [phone]         Sarah Atlas, MD. Schedule an appointment as soon as possible for a visit in 1 week(s).   Specialty: Internal Medicine Contact information: [address] [place] [address] [phone]         [facility] Follow up.   Specialty: Cardiology Why: Office will call with date/time, If you dont hear from them,please give them a call Contact information: [address], [address] [place]  [phone]               No Known Allergies   Other Procedures/Studies: ECHOCARDIOGRAM COMPLETE Result Date: 03/02/2024    ECHOCARDIOGRAM REPORT   Patient Name:   VEORA FONTE Date of Exam: 03/02/2024 Medical Rec #:  [id number]         Height:       57.0 in Accession #:    [id number]        Weight:       134.7 lb Date of Birth:  [birth year]        BSA:          1.521 m Patient Age:    89 years          BP:           115/52 mmHg Patient Gender: F                 HR:           64 bpm. Exam Location:  Inpatient Procedure: 2D Echo, Cardiac Doppler and Color Doppler (Both  Spectral and Color            Flow Doppler were utilized during procedure). Indications:    Syncope  History:        Patient has no prior history of Echocardiogram examinations.                 Risk Factors:Hypertension and Diabetes.  Sonographer:    Jayson Gaskins Referring Phys: [id number] Sarah KATHEE MELVIN IMPRESSIONS  1. Left ventricular ejection fraction, by estimation, is 55 to 60%. The left ventricle has normal function. The left ventricle has no regional wall motion abnormalities. There is moderate left ventricular hypertrophy of the basal-septal segment. Left ventricular diastolic parameters are consistent with Grade I diastolic dysfunction (impaired relaxation).  2. Right ventricular systolic function is normal. The right ventricular size is normal.  3. The mitral valve is normal in structure. No evidence of mitral valve regurgitation. No evidence of mitral stenosis. Moderate mitral annular calcification.  4. The aortic valve is calcified. Aortic valve regurgitation is not visualized. No aortic stenosis is present.  5. The inferior vena cava is normal in size with greater than 50% respiratory variability, suggesting right atrial pressure of 3 mmHg. FINDINGS  Left Ventricle: Left ventricular ejection fraction, by estimation, is 55 to 60%. The left ventricle has normal function. The left ventricle has no regional wall motion abnormalities. The left ventricular internal cavity size was normal in size. There is  moderate left ventricular hypertrophy of the basal-septal segment. Left ventricular diastolic parameters are consistent with Grade I diastolic dysfunction (impaired relaxation). Right Ventricle: The right ventricular size is normal. No increase in right ventricular wall thickness. Right ventricular systolic function is normal. Left Atrium: Left atrial size was normal in size. Right Atrium: Right atrial size was normal in size. Pericardium: There is no evidence of pericardial effusion. Mitral Valve: The  mitral valve is normal in structure. There is mild calcification of the mitral valve leaflet(s). Moderate mitral annular calcification. No evidence of mitral valve regurgitation. No evidence of mitral valve stenosis. Tricuspid Valve: The tricuspid valve is normal in structure. Tricuspid valve regurgitation is not demonstrated. No evidence of tricuspid stenosis. Aortic Valve: The aortic valve  is calcified. Aortic valve regurgitation is not visualized. No aortic stenosis is present. Aortic valve mean gradient measures 2.0 mmHg. Aortic valve peak gradient measures 3.5 mmHg. Aortic valve area, by VTI measures 2.65 cm. Pulmonic Valve: The pulmonic valve was normal in structure. Pulmonic valve regurgitation is not visualized. No evidence of pulmonic stenosis. Aorta: The aortic root is normal in size and structure. Venous: The inferior vena cava is normal in size with greater than 50% respiratory variability, suggesting right atrial pressure of 3 mmHg. IAS/Shunts: No atrial level shunt detected by color flow Doppler.  LEFT VENTRICLE PLAX 2D LVIDd:         2.43 cm   Diastology LVIDs:         1.77 cm   LV e' medial:    6.53 cm/s LV PW:         1.11 cm   LV E/e' medial:  11.7 LV IVS:        1.60 cm   LV e' lateral:   6.20 cm/s LVOT diam:     1.82 cm   LV E/e' lateral: 12.3 LV SV:         56 LV SV Index:   37 LVOT Area:     2.60 cm  RIGHT VENTRICLE RV S prime:     8.16 cm/s TAPSE (M-mode): 1.8 cm LEFT ATRIUM             Index        RIGHT ATRIUM          Index LA Vol (A2C):   27.3 ml 17.95 ml/m  RA Area:     7.62 cm LA Vol (A4C):   21.6 ml 14.21 ml/m  RA Volume:   11.10 ml 7.30 ml/m LA Biplane Vol: 26.3 ml 17.30 ml/m  AORTIC VALVE AV Area (Vmax):    2.67 cm AV Area (Vmean):   2.48 cm AV Area (VTI):     2.65 cm AV Vmax:           93.20 cm/s AV Vmean:          74.300 cm/s AV VTI:            0.211 m AV Peak Grad:      3.5 mmHg AV Mean Grad:      2.0 mmHg LVOT Vmax:         95.80 cm/s LVOT Vmean:        70.800 cm/s LVOT  VTI:          0.215 m LVOT/AV VTI ratio: 1.02  AORTA Ao Root diam: 2.52 cm MITRAL VALVE MV Area (PHT): 2.24 cm     SHUNTS MV Decel Time: 339 msec     Systemic VTI:  0.22 m MV E velocity: 76.50 cm/s   Systemic Diam: 1.82 cm MV A velocity: 106.00 cm/s MV E/A ratio:  0.72 Aditya Sabharwal Electronically signed by Ria Commander Signature Date/Time: 03/02/2024/2:12:54 PM    Final    CT Angio Chest PE W and/or Wo Contrast Result Date: 03/01/2024 CLINICAL DATA:  Concern for pulmonary embolism. EXAM: CT ANGIOGRAPHY CHEST WITH CONTRAST TECHNIQUE: Multidetector CT imaging of the chest was performed using the standard protocol during bolus administration of intravenous contrast. Multiplanar CT image reconstructions and MIPs were obtained to evaluate the vascular anatomy. RADIATION DOSE REDUCTION: This exam was performed according to the departmental dose-optimization program which includes automated exposure control, adjustment of the mA and/or kV according to patient size and/or use of iterative reconstruction technique.  CONTRAST:  75mL OMNIPAQUE  IOHEXOL  350 MG/ML SOLN COMPARISON:  Chest CT dated 09/25/2023. FINDINGS: Cardiovascular: There is no cardiomegaly or pericardial effusion. There is mild atherosclerotic calcification of the thoracic aorta. No aneurysmal dilatation or dissection. The origins of the great vessels of the aortic arch appear patent. No pulmonary artery embolus identified. Mediastinum/Nodes: No hilar or mediastinal adenopathy. The esophagus is grossly unremarkable. Partial thyroidectomy. Enlarged left thyroid  lobe with multiple nodules likely multinodular goiter. In the setting of significant comorbidities or limited life expectancy, no follow-up recommended (ref: J Am Coll Radiol. 2015 Feb;12(2): 143-50). No mediastinal fluid collection. Lungs/Pleura: Faint cluster of ground-glass nodularity in the right upper lobe (37/6) concerning for atypical infiltrate. This is relatively similar to prior CT.  There is right lung base atelectasis. No pleural effusion pneumothorax. The central airways are patent. Upper Abdomen: Colonic diverticulosis. Musculoskeletal: Osteopenia with degenerative changes of the spine. No acute osseous pathology. Review of the MIP images confirms the above findings. IMPRESSION: 1. No CT evidence of pulmonary artery embolus. 2. Faint cluster of ground-glass nodularity in the right upper lobe concerning for atypical infiltrate. 3.  Aortic Atherosclerosis (ICD10-I70.0). Electronically Signed   By: Vanetta Chou M.D.   On: 03/01/2024 17:16   DG Chest Portable 1 View Result Date: 03/01/2024 CLINICAL DATA:  syncope. EXAM: PORTABLE CHEST 1 VIEW COMPARISON:  01/15/2024. FINDINGS: Low lung volume. Bilateral lung fields are clear. Bilateral costophrenic angles are clear. Normal cardio-mediastinal silhouette. No acute osseous abnormalities. The soft tissues are within normal limits. IMPRESSION: No active disease. Electronically Signed   By: Ree Molt M.D.   On: 03/01/2024 15:49     TODAY-DAY OF DISCHARGE:  Subjective:   [place] today has no headache,no chest abdominal pain,no new weakness tingling or numbness, feels much better wants to go home today.   Objective:   Blood pressure (!) 115/52, pulse 66, temperature 97.6 F (36.4 C), temperature source Oral, resp. rate (!) 26, height 4' 9 (1.448 m), weight 61.1 kg, SpO2 98%.  Intake/Output Summary (Last 24 hours) at 03/02/2024 1457 Last data filed at 03/01/2024 2300 Gross per 24 hour  Intake 340.04 ml  Output --  Net 340.04 ml   Filed Weights   03/01/24 1931  Weight: 61.1 kg    Exam: Awake Alert, Oriented *3, No new F.N deficits, Normal affect [place].AT,PERRAL Supple Neck,No JVD, No cervical lymphadenopathy appriciated.  Symmetrical Chest wall movement, Good air movement bilaterally, CTAB RRR,No Gallops,Rubs or new Murmurs, No Parasternal Heave +ve B.Sounds, Abd Soft, Non tender, No organomegaly  appriciated, No rebound -guarding or rigidity. No Cyanosis, Clubbing or edema, No new Rash or bruise   PERTINENT RADIOLOGIC STUDIES: ECHOCARDIOGRAM COMPLETE Result Date: 03/02/2024    ECHOCARDIOGRAM REPORT   Patient Name:   MARINDA TYER Date of Exam: 03/02/2024 Medical Rec #:  [id number]         Height:       57.0 in Accession #:    [id number]        Weight:       134.7 lb Date of Birth:  1928-01-03        BSA:          1.521 m Patient Age:    89 years          BP:           115/52 mmHg Patient Gender: F                 HR:  64 bpm. Exam Location:  Inpatient Procedure: 2D Echo, Cardiac Doppler and Color Doppler (Both Spectral and Color            Flow Doppler were utilized during procedure). Indications:    Syncope  History:        Patient has no prior history of Echocardiogram examinations.                 Risk Factors:Hypertension and Diabetes.  Sonographer:    Jayson Gaskins Referring Phys: [id number] Sarah NOVAK MELVIN IMPRESSIONS  1. Left ventricular ejection fraction, by estimation, is 55 to 60%. The left ventricle has normal function. The left ventricle has no regional wall motion abnormalities. There is moderate left ventricular hypertrophy of the basal-septal segment. Left ventricular diastolic parameters are consistent with Grade I diastolic dysfunction (impaired relaxation).  2. Right ventricular systolic function is normal. The right ventricular size is normal.  3. The mitral valve is normal in structure. No evidence of mitral valve regurgitation. No evidence of mitral stenosis. Moderate mitral annular calcification.  4. The aortic valve is calcified. Aortic valve regurgitation is not visualized. No aortic stenosis is present.  5. The inferior vena cava is normal in size with greater than 50% respiratory variability, suggesting right atrial pressure of 3 mmHg. FINDINGS  Left Ventricle: Left ventricular ejection fraction, by estimation, is 55 to 60%. The left ventricle has normal function.  The left ventricle has no regional wall motion abnormalities. The left ventricular internal cavity size was normal in size. There is  moderate left ventricular hypertrophy of the basal-septal segment. Left ventricular diastolic parameters are consistent with Grade I diastolic dysfunction (impaired relaxation). Right Ventricle: The right ventricular size is normal. No increase in right ventricular wall thickness. Right ventricular systolic function is normal. Left Atrium: Left atrial size was normal in size. Right Atrium: Right atrial size was normal in size. Pericardium: There is no evidence of pericardial effusion. Mitral Valve: The mitral valve is normal in structure. There is mild calcification of the mitral valve leaflet(s). Moderate mitral annular calcification. No evidence of mitral valve regurgitation. No evidence of mitral valve stenosis. Tricuspid Valve: The tricuspid valve is normal in structure. Tricuspid valve regurgitation is not demonstrated. No evidence of tricuspid stenosis. Aortic Valve: The aortic valve is calcified. Aortic valve regurgitation is not visualized. No aortic stenosis is present. Aortic valve mean gradient measures 2.0 mmHg. Aortic valve peak gradient measures 3.5 mmHg. Aortic valve area, by VTI measures 2.65 cm. Pulmonic Valve: The pulmonic valve was normal in structure. Pulmonic valve regurgitation is not visualized. No evidence of pulmonic stenosis. Aorta: The aortic root is normal in size and structure. Venous: The inferior vena cava is normal in size with greater than 50% respiratory variability, suggesting right atrial pressure of 3 mmHg. IAS/Shunts: No atrial level shunt detected by color flow Doppler.  LEFT VENTRICLE PLAX 2D LVIDd:         2.43 cm   Diastology LVIDs:         1.77 cm   LV e' medial:    6.53 cm/s LV PW:         1.11 cm   LV E/e' medial:  11.7 LV IVS:        1.60 cm   LV e' lateral:   6.20 cm/s LVOT diam:     1.82 cm   LV E/e' lateral: 12.3 LV SV:         56 LV  SV Index:   37 LVOT Area:  2.60 cm  RIGHT VENTRICLE RV S prime:     8.16 cm/s TAPSE (M-mode): 1.8 cm LEFT ATRIUM             Index        RIGHT ATRIUM          Index LA Vol (A2C):   27.3 ml 17.95 ml/m  RA Area:     7.62 cm LA Vol (A4C):   21.6 ml 14.21 ml/m  RA Volume:   11.10 ml 7.30 ml/m LA Biplane Vol: 26.3 ml 17.30 ml/m  AORTIC VALVE AV Area (Vmax):    2.67 cm AV Area (Vmean):   2.48 cm AV Area (VTI):     2.65 cm AV Vmax:           93.20 cm/s AV Vmean:          74.300 cm/s AV VTI:            0.211 m AV Peak Grad:      3.5 mmHg AV Mean Grad:      2.0 mmHg LVOT Vmax:         95.80 cm/s LVOT Vmean:        70.800 cm/s LVOT VTI:          0.215 m LVOT/AV VTI ratio: 1.02  AORTA Ao Root diam: 2.52 cm MITRAL VALVE MV Area (PHT): 2.24 cm     SHUNTS MV Decel Time: 339 msec     Systemic VTI:  0.22 m MV E velocity: 76.50 cm/s   Systemic Diam: 1.82 cm MV A velocity: 106.00 cm/s MV E/A ratio:  0.72 Aditya Sabharwal Electronically signed by Ria Commander Signature Date/Time: 03/02/2024/2:12:54 PM    Final    CT Angio Chest PE W and/or Wo Contrast Result Date: 03/01/2024 CLINICAL DATA:  Concern for pulmonary embolism. EXAM: CT ANGIOGRAPHY CHEST WITH CONTRAST TECHNIQUE: Multidetector CT imaging of the chest was performed using the standard protocol during bolus administration of intravenous contrast. Multiplanar CT image reconstructions and MIPs were obtained to evaluate the vascular anatomy. RADIATION DOSE REDUCTION: This exam was performed according to the departmental dose-optimization program which includes automated exposure control, adjustment of the mA and/or kV according to patient size and/or use of iterative reconstruction technique. CONTRAST:  75mL OMNIPAQUE  IOHEXOL  350 MG/ML SOLN COMPARISON:  Chest CT dated 09/25/2023. FINDINGS: Cardiovascular: There is no cardiomegaly or pericardial effusion. There is mild atherosclerotic calcification of the thoracic aorta. No aneurysmal dilatation or dissection.  The origins of the great vessels of the aortic arch appear patent. No pulmonary artery embolus identified. Mediastinum/Nodes: No hilar or mediastinal adenopathy. The esophagus is grossly unremarkable. Partial thyroidectomy. Enlarged left thyroid  lobe with multiple nodules likely multinodular goiter. In the setting of significant comorbidities or limited life expectancy, no follow-up recommended (ref: J Am Coll Radiol. 2015 Feb;12(2): 143-50). No mediastinal fluid collection. Lungs/Pleura: Faint cluster of ground-glass nodularity in the right upper lobe (37/6) concerning for atypical infiltrate. This is relatively similar to prior CT. There is right lung base atelectasis. No pleural effusion pneumothorax. The central airways are patent. Upper Abdomen: Colonic diverticulosis. Musculoskeletal: Osteopenia with degenerative changes of the spine. No acute osseous pathology. Review of the MIP images confirms the above findings. IMPRESSION: 1. No CT evidence of pulmonary artery embolus. 2. Faint cluster of ground-glass nodularity in the right upper lobe concerning for atypical infiltrate. 3.  Aortic Atherosclerosis (ICD10-I70.0). Electronically Signed   By: Vanetta Chou M.D.   On: 03/01/2024 17:16   DG Chest Portable  1 View Result Date: 03/01/2024 CLINICAL DATA:  syncope. EXAM: PORTABLE CHEST 1 VIEW COMPARISON:  01/15/2024. FINDINGS: Low lung volume. Bilateral lung fields are clear. Bilateral costophrenic angles are clear. Normal cardio-mediastinal silhouette. No acute osseous abnormalities. The soft tissues are within normal limits. IMPRESSION: No active disease. Electronically Signed   By: Ree Molt M.D.   On: 03/01/2024 15:49     PERTINENT LAB RESULTS: CBC: Recent Labs    03/01/24 1301 03/02/24 0613  WBC 6.4 8.9  HGB 11.4* 11.8*  HCT 35.9* 35.3*  PLT 244 227   CMET CMP     Component Value Date/Time   NA 137 03/02/2024 0613   NA 142 02/14/2009 1333   K 3.8 03/02/2024 0613   K 4.4  02/14/2009 1333   CL 107 03/02/2024 0613   CL 100 02/14/2009 1333   CO2 22 03/02/2024 0613   CO2 31 02/14/2009 1333   GLUCOSE 99 03/02/2024 0613   GLUCOSE 143 (H) 02/14/2009 1333   BUN 15 03/02/2024 0613   BUN 16 02/14/2009 1333   CREATININE 0.98 03/02/2024 0613   CREATININE 0.81 09/05/2020 0000   CALCIUM 9.3 03/02/2024 0613   CALCIUM 9.7 02/14/2009 1333   PROT 6.6 03/02/2024 0613   PROT 7.8 02/14/2009 1333   ALBUMIN 3.0 (L) 03/02/2024 0613   ALBUMIN 3.3 02/14/2009 1333   AST 32 03/02/2024 0613   AST 32 02/14/2009 1333   ALT 13 03/02/2024 0613   ALT 18 02/14/2009 1333   ALKPHOS 47 03/02/2024 0613   ALKPHOS 60 02/14/2009 1333   BILITOT 2.0 (H) 03/02/2024 0613   BILITOT 0.80 02/14/2009 1333   GFRNONAA 54 (L) 03/02/2024 0613   GFRNONAA 64 09/05/2020 0000    GFR Estimated Creatinine Clearance: 27 mL/min (by C-G formula based on SCr of 0.98 mg/dL). No results for input(s): LIPASE, AMYLASE in the last 72 hours. No results for input(s): CKTOTAL, CKMB, CKMBINDEX, TROPONINI in the last 72 hours. Invalid input(s): POCBNP Recent Labs    03/01/24 1515  DDIMER 1.65*   No results for input(s): HGBA1C in the last 72 hours. No results for input(s): CHOL, HDL, LDLCALC, TRIG, CHOLHDL, LDLDIRECT in the last 72 hours. No results for input(s): TSH, T4TOTAL, T3FREE, THYROIDAB in the last 72 hours.  Invalid input(s): FREET3 No results for input(s): VITAMINB12, FOLATE, FERRITIN, TIBC, IRON, RETICCTPCT in the last 72 hours. Coags: No results for input(s): INR in the last 72 hours.  Invalid input(s): PT Microbiology: Recent Results (from the past 240 hours)  Resp panel by RT-PCR (RSV, Flu A&B, Covid) Anterior Nasal Swab     Status: None   Collection Time: 03/01/24  5:23 PM   Specimen: Anterior Nasal Swab  Result Value Ref Range Status   SARS Coronavirus 2 by RT PCR NEGATIVE NEGATIVE Final   Influenza A by PCR NEGATIVE NEGATIVE Final    Influenza B by PCR NEGATIVE NEGATIVE Final    Comment: (NOTE) The Xpert Xpress SARS-CoV-2/FLU/RSV plus assay is intended as an aid in the diagnosis of influenza from Nasopharyngeal swab specimens and should not be used as a sole basis for treatment. Nasal washings and aspirates are unacceptable for Xpert Xpress SARS-CoV-2/FLU/RSV testing.  Fact Sheet for Patients: BloggerCourse.com  Fact Sheet for Healthcare Providers: SeriousBroker.it  This test is not yet approved or cleared by the [place]  FDA and has been authorized for detection and/or diagnosis of SARS-CoV-2 by FDA under an Emergency Use Authorization (EUA). This EUA will remain in effect (meaning this test can be  used) for the duration of the COVID-19 declaration under Section 564(b)(1) of the Act, 21 U.S.C. section 360bbb-3(b)(1), unless the authorization is terminated or revoked.     Resp Syncytial Virus by PCR NEGATIVE NEGATIVE Final    Comment: (NOTE) Fact Sheet for Patients: BloggerCourse.com  Fact Sheet for Healthcare Providers: SeriousBroker.it  This test is not yet approved or cleared by the [place]  FDA and has been authorized for detection and/or diagnosis of SARS-CoV-2 by FDA under an Emergency Use Authorization (EUA). This EUA will remain in effect (meaning this test can be used) for the duration of the COVID-19 declaration under Section 564(b)(1) of the Act, 21 U.S.C. section 360bbb-3(b)(1), unless the authorization is terminated or revoked.  Performed at [facility], [address], [place], [place] [address]     FURTHER DISCHARGE INSTRUCTIONS:  Get Medicines reviewed and adjusted: Please take all your medications with you for your next visit with your Primary MD  Laboratory/radiological data: Please request your Primary MD to go over all hospital tests and procedure/radiological  results at the follow up, please ask your Primary MD to get all Hospital records sent to his/her office.  In some cases, they will be blood work, cultures and biopsy results pending at the time of your discharge. Please request that your primary care M.D. goes through all the records of your hospital data and follows up on these results.  Also Note the following: If you experience worsening of your admission symptoms, develop shortness of breath, life threatening emergency, suicidal or homicidal thoughts you must seek medical attention immediately by calling 911 or calling your MD immediately  if symptoms less severe.  You must read complete instructions/literature along with all the possible adverse reactions/side effects for all the Medicines you take and that have been prescribed to you. Take any new Medicines after you have completely understood and accpet all the possible adverse reactions/side effects.   Do not drive when taking Pain medications or sleeping medications (Benzodaizepines)  Do not take more than prescribed Pain, Sleep and Anxiety Medications. It is not advisable to combine anxiety,sleep and pain medications without talking with your primary care practitioner  Special Instructions: If you have smoked or chewed Tobacco  in the last 2 yrs please stop smoking, stop any regular Alcohol  and or any Recreational drug use.  Wear Seat belts while driving.  Please note: You were cared for by a hospitalist during your hospital stay. Once you are discharged, your primary care physician will handle any further medical issues. Please note that NO REFILLS for any discharge medications will be authorized once you are discharged, as it is imperative that you return to your primary care physician (or establish a relationship with a primary care physician if you do not have one) for your post hospital discharge needs so that they can reassess your need for medications and monitor your lab  values.  Total Time spent coordinating discharge including counseling, education and face to face time equals greater than 30 minutes.  Signed: Donalda Applebaum 03/02/2024 2:57 PM

## 2024-03-02 NOTE — Progress Notes (Signed)
 Brief note 89 year old with history of hypertension, HLD, glaucoma-prior history of syncopal event in January 2025-presented to the hospital following a [place]opal episode at [place].  Per patient-she had a prodrome of lightheadedness/felt funny before she lost consciousness.  She had a similar episode in January-back then she apparently was hypotensive-had a brief ED stay and was discharged home.  Telemetry overnight has been unremarkable  8/27>> CTA chest: No PE  Assessment/plan Syncope Second episode this year Unclear etiology-await echo Telemetry unremarkable so far Check orthostatics Mobilize with nursing staff/PT staff If echo unremarkable-discharge home later today-will discuss with cardiology coordinator to see if we can get outpatient cardiac monitor set up/followed by a quick cardiology follow-up.

## 2024-03-06 LAB — CULTURE, BLOOD (ROUTINE X 2)
Culture: NO GROWTH
Culture: NO GROWTH

## 2024-04-03 ENCOUNTER — Encounter: Payer: Self-pay | Admitting: *Deleted

## 2024-04-04 ENCOUNTER — Ambulatory Visit: Admitting: Cardiology

## 2024-04-05 ENCOUNTER — Ambulatory Visit: Admitting: Emergency Medicine

## 2024-04-05 NOTE — Progress Notes (Deleted)
  Cardiology Office Note:    Date:  04/05/2024  ID:  [name], DOB 1927-08-13, MRN [id number] PCP: Shelda Atlas, MD  [facility] Providers Cardiologist:  Alvan Ronal BRAVO, MD (Inactive) { Click to update primary MD,subspecialty MD or APP then REFRESH:1}    {Click to Open Review  :1}   Patient Profile:       Chief Complaint: *** History of Present Illness:  [name] is a 89 y.o. female with visit-pertinent history of hypertension, hyperlipidemia, glaucoma  Patient was admitted to the hospital on 03/01/2024 following a syncopal episode.  He was found unresponsive/slumped over at a local disc eval prior.  Per patient she felt uneasy/lightheaded prior to the episode.  Claims that she had a similar episode in January of this year.  Per daughter this is likely her third episode of unexplained syncope.  Her telemetry was without arrhythmias.  Her blood pressure stable, borderline orthostatic but essentially asymptomatic.  She was to hold her HCTZ.  And losartan .  Echocardiogram 8/28 showed LVEF 55 to 60%, no RWMA, moderate LVH of the basal septal segment, grade 1 DD, RV function and size normal, no valvular abnormalities.  Heart monitor was placed and discharged is currently pending.  Discussed the use of AI scribe software for clinical note transcription with the patient, who gave verbal consent to proceed.  History of Present Illness     Review of systems:  Please see the history of present illness. All other systems are reviewed and otherwise negative. ***      Studies Reviewed:        ***  Risk Assessment/Calculations:   {Does this patient have ATRIAL FIBRILLATION?:[id number]} No BP recorded.  {Refresh Note OR Click here to enter BP  :1}***        Physical Exam:   VS:  There were no vitals taken for this visit.   Wt Readings from Last 3 Encounters:  03/01/24 134 lb 11.2 oz (61.1 kg)  11/10/23 132 lb 4.4 oz (60 kg)  09/27/23 132 lb 7.9 oz (60.1 kg)     GEN: Well nourished, well developed in no acute distress NECK: No JVD; No carotid bruits CARDIAC: ***RRR, no murmurs, rubs, gallops RESPIRATORY:  Clear to auscultation without rales, wheezing or rhonchi  ABDOMEN: Soft, non-tender, non-distended EXTREMITIES:  No edema; No acute deformity ***      Assessment and Plan:    Assessment and Plan Assessment & Plan      {Are you ordering a CV Procedure (e.g. stress test, cath, DCCV, TEE, etc)?   Press F2        :[id number]}  Dispo:  No follow-ups on file.  Signed, Lum LITTIE Louis, NP

## 2024-04-06 DIAGNOSIS — R55 Syncope and collapse: Secondary | ICD-10-CM | POA: Diagnosis not present

## 2024-04-06 NOTE — Addendum Note (Signed)
 Encounter addended by: Malvina Pina A on: 04/06/2024 10:03 AM  Actions taken: Imaging Exam ended

## 2024-04-07 ENCOUNTER — Ambulatory Visit: Payer: Self-pay | Admitting: Cardiology

## 2024-04-11 NOTE — Progress Notes (Signed)
 Pt has been made aware of normal result and verbalized understanding.  jw

## 2024-04-19 ENCOUNTER — Ambulatory Visit: Admitting: Emergency Medicine

## 2024-04-20 ENCOUNTER — Ambulatory Visit: Attending: Emergency Medicine | Admitting: Emergency Medicine

## 2024-04-20 ENCOUNTER — Encounter: Payer: Self-pay | Admitting: Emergency Medicine

## 2024-04-20 VITALS — BP 138/80 | HR 72 | Ht 62.0 in | Wt 134.0 lb

## 2024-04-20 DIAGNOSIS — R55 Syncope and collapse: Secondary | ICD-10-CM

## 2024-04-20 NOTE — Patient Instructions (Signed)
 Medication Instructions:  NO CHANGES  Lab Work: NONE TO BE DONE TODAY.  Testing/Procedures: NONE  Follow-Up: At [facility], you and your health needs are our priority.  As part of our continuing mission to provide you with exceptional heart care, our providers are all part of one team.  This team includes your primary Cardiologist (physician) and Advanced Practice Providers or APPs (Physician Assistants and Nurse Practitioners) who all work together to provide you with the care you need, when you need it.  Your next appointment:   1 YEAR  Provider:   MADISON FOUNTAIN, NP

## 2024-04-20 NOTE — Progress Notes (Signed)
 Cardiology Office Note:    Date:  04/20/2024  ID:  [name], DOB 04/27/1928, MRN [id number] PCP: Shelda Atlas, MD  [facility] Providers Cardiologist:  Alvan Ronal BRAVO, MD (Inactive)       Patient Profile:       Chief Complaint: Hospital follow-up for syncope History of Present Illness:  [name] is a 89 y.o. female with visit-pertinent history of hypertension, hyperlipidemia, glaucoma  She was initially seen by cardiology service on 09/24/2022.  She was referred due to left bundle branch block.  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  TTE 01/2014 showed LVEF 55 to 60%, RV normal, no significant valvular disease.  She was without any significant symptoms and is to follow-up as needed.  Patient was admitted to the hospital on 03/01/2024 following a syncopal episode.  She was found unresponsive/slumped [place].  Per patient she felt uneasy/lightheaded prior to the episode.  Claims that she had a similar episode in January of this year.  Per daughter this is likely her third episode of unexplained syncope.  Her telemetry was without arrhythmias.  Her blood pressure stable, borderline orthostatic but essentially asymptomatic.  She was to hold her HCTZ and losartan .  Echocardiogram 8/28 showed LVEF 55 to 60%, no RWMA, moderate LVH of the basal septal segment, grade 1 DD, RV function and size normal, no valvular abnormalities.  Heart monitor was placed at discharge and showed average heart rate of 73 bpm.  Predominant underlying rhythm was sinus rhythm.  Bundle branch block/IVCD was present.  5 SVT runs occurred, the run with the fastest interval lasting 8 beats with max rate of 154 bpm, the longest lasting 9 beats with an average rate of 132 bpm.  There were no episodes of severe bradycardia, pauses, or high-grade AV block.  No rhythm abnormalities capable of causing syncope were  identified.   Discussed the use of AI scribe software for clinical note transcription with the patient, who gave verbal consent to proceed.  History of Present Illness [name] is a 89 year old female who presents with episodes of syncope.  She is accompanied by her daughter today.  She experiences episodes of syncope characterized by a peculiar sensation followed by loss of consciousness. These episodes have occurred in various settings, including church, a funeral, and a restaurant, but not in the last two months. There is no associated lightheadedness, dizziness, dyspnea, or chest pain.  She denies any further episodes of syncope since hospitalization.  She is an avid coffee drinker, which may contribute to dehydration. She is trying to balance her fluid intake with incontinence issues. She follows a low-sodium diet, with meals prepared by her family.    Review of systems:  Please see the history of present illness. All other systems are reviewed and otherwise negative.      Studies Reviewed:        ZIO 03/02/2024   The dominant rhythm is normal sinus rhythm with normal circadian variation.  There is underlying intraventricular conduction delay.   There are rare premature supraventricular beats and only 5 very brief episodes of nonsustained ectopic atrial tachycardia.  There is no atrial fibrillation.   There are rare premature ventricular beats.  There is no evidence of ventricular tachycardia.   There are no episodes of severe bradycardia, pauses or high-grade AV block.   Electronically signed by Jerel Balding MD   Minimally abnormal  rhythm monitor, with a handful of very brief episodes of nonsustained atrial tachycardia which are of doubtful clinical significance. No rhythm abnormalities capable of causing syncope were identified.     Patch Wear Time:  13 days and 19 hours (2025-08-28T14:40:00-0400 to 2025-09-11T10:30:39-0400)   Patient had a min HR of 49 bpm, max  HR of 154 bpm, and avg HR of 73 bpm. Predominant underlying rhythm was Sinus Rhythm. Bundle Branch Block/IVCD was present. 5 Supraventricular Tachycardia runs occurred, the run with the fastest interval lasting 8 beats  with a max rate of 154 bpm, the longest lasting 9 beats with an avg rate of 132 bpm. Isolated SVEs were rare (<1.0%), SVE Couplets were rare (<1.0%), and SVE Triplets were rare (<1.0%). Isolated VEs were rare (<1.0%), and no VE Couplets or VE Triplets  were present. Ventricular Trigeminy was present.  Echocardiogram 03/02/2024 1. Left ventricular ejection fraction, by estimation, is 55 to 60%. The  left ventricle has normal function. The left ventricle has no regional  wall motion abnormalities. There is moderate left ventricular hypertrophy  of the basal-septal segment. Left  ventricular diastolic parameters are consistent with Grade I diastolic  dysfunction (impaired relaxation).   2. Right ventricular systolic function is normal. The right ventricular  size is normal.   3. The mitral valve is normal in structure. No evidence of mitral valve  regurgitation. No evidence of mitral stenosis. Moderate mitral annular  calcification.   4. The aortic valve is calcified. Aortic valve regurgitation is not  visualized. No aortic stenosis is present.   5. The inferior vena cava is normal in size with greater than 50%  respiratory variability, suggesting right atrial pressure of 3 mmHg.   Risk Assessment/Calculations:              Physical Exam:   VS:  BP 138/80 (BP Location: Left Arm, Patient Position: Sitting, Cuff Size: Normal)   Pulse 72   Ht 5' 2 (1.575 m)   Wt 134 lb (60.8 kg)   SpO2 97%   BMI 24.51 kg/m    Wt Readings from Last 3 Encounters:  04/20/24 134 lb (60.8 kg)  03/01/24 134 lb 11.2 oz (61.1 kg)  11/10/23 132 lb 4.4 oz (60 kg)    GEN: Well nourished, well developed in no acute distress NECK: No JVD; No carotid bruits CARDIAC: RRR, no murmurs, rubs,  gallops RESPIRATORY:  Clear to auscultation without rales, wheezing or rhonchi  ABDOMEN: Soft, non-tender, non-distended EXTREMITIES:  No edema; No acute deformity      Assessment and Plan:  Syncope Admitted 02/2024 for syncope which was thought to be her third episode Echocardiogram 02/2024 with LVEF 55 to 60%, no RWMA, moderate LVH, grade 1 DD, RV normal, no valvular abnormalities Cardiac event monitor 02/2024 showing no rhythm abnormalities capable of causing syncope identified.  No severe bradycardia, pauses, VT, or high-grade AV block - She reports during her 3 episodes she was sitting occurring in various settings including a church, funeral, and a restaurant.  She did not collapse during any of these events.  Denied any prodromal symptoms such as chest pains, dyspnea, lightheadedness, or dizziness.  She describes the events as mild diaphoresis and then slowly beginning to lose consciousness making it difficult to be aroused.  No loss of bowel/bladder or seizure activity noted - Today she is without any further episodes of syncope.  Her symptoms described are not typical of arrhythmia or cardiac cause.  - Possibly due to orthostatic hypotension or dehydration.  She is an avid coffee drinker and is trying to balance her fluid intake with her incontinence issues.  I will have her increase her hydration with added electrolytes - Given her reassuring echocardiogram and cardiac event monitor no further interventions are warranted at this time      Dispo:  Return in about 1 year (around 04/20/2025).  Signed, Lum LITTIE Louis, NP

## 2024-05-26 ENCOUNTER — Ambulatory Visit (HOSPITAL_COMMUNITY)
Admission: EM | Admit: 2024-05-26 | Discharge: 2024-05-26 | Disposition: A | Discharge status: Home / Self Care | Attending: Physician Assistant | Admitting: Physician Assistant

## 2024-05-26 ENCOUNTER — Encounter (HOSPITAL_COMMUNITY): Payer: Self-pay

## 2024-05-26 DIAGNOSIS — I10 Essential (primary) hypertension: Secondary | ICD-10-CM | POA: Diagnosis not present

## 2024-05-26 MED ORDER — AMLODIPINE BESYLATE 2.5 MG PO TABS: 2.5000 mg | ORAL_TABLET | Freq: Every day | ORAL | 0 refills | Status: DC

## 2024-05-26 MED ORDER — AMLODIPINE BESYLATE 5 MG PO TABS: 5.0000 mg | ORAL_TABLET | Freq: Every day | ORAL | 0 refills | Status: AC

## 2024-05-26 NOTE — ED Provider Notes (Signed)
 [facility]    CSN: [id number] Arrival date & time: 05/26/24  1718      History   Chief Complaint Chief Complaint  Patient presents with   Hypertension    HPI [name] is a 89 y.o. female.   Patient presents today for evaluation of elevated blood pressure reading.  She reports that she has a history of hypertension but had multiple syncopal episodes and was last hospitalized for this condition March 01, 2024 at which point her blood pressure medications were stopped.  She has been monitoring it regularly reports that it was ranging from 160-190 systolic and between 80 and 100 diastolic.  Today her blood pressure reached a maximum of 225/110 which prompted her to be evaluated.  She initially called her primary care but they were unable to see her and recommended that she be seen in urgent care.  She denies any dietary changes or medication changes.  She is not currently taking any antihypertensive medications.  She denies any headache but did feel just a little bit woozy earlier today.  She denies any vision change, leg swelling, chest pain, shortness of breath.    Past Medical History:  Diagnosis Date   Anemia    Breast cancer (HCC) 2002   Right   Cataract    Diabetes mellitus without complication (HCC)    Glaucoma    High cholesterol    Hypertension    Influenza A with pneumonia 09/25/2023   Personal history of chemotherapy    Personal history of radiation therapy     Patient Active Problem List   Diagnosis Date Noted   Syncope and collapse 03/01/2024   Acute gallstone pancreatitis 09/25/2023   Abnormal LFTs (liver function tests) 09/25/2023   Essential hypertension 09/25/2023   Aortic atherosclerosis 09/25/2023   Abdominal pain 09/25/2023   Hyperlipidemia 09/25/2023   Primary open angle glaucoma (POAG) of right eye, moderate stage 02/23/2018   Primary open angle glaucoma (POAG) of left eye, severe stage 02/23/2018   Pseudophakia of both eyes  01/26/2018   Lower abdominal pain 10/21/2016   Chronic hip pain 10/21/2016   Syncope 01/27/2014    Past Surgical History:  Procedure Laterality Date   ABDOMINAL HYSTERECTOMY     APPENDECTOMY     BREAST LUMPECTOMY Right 2001   EYE SURGERY      OB History   No obstetric history on file.      Home Medications    Prior to Admission medications   Medication Sig Start Date End Date Taking? Authorizing Provider  amLODipine  (NORVASC ) 5 MG tablet Take 1 tablet (5 mg total) by mouth daily. 05/26/24   Yashira Offenberger K, PA-C  brimonidine  (ALPHAGAN ) 0.2 % ophthalmic solution Place 1 drop into the right eye 2 (two) times daily.    [provider]  diclofenac  Sodium (VOLTAREN ) 1 % GEL Apply 2 g topically 4 (four) times daily as needed (for pain- affected areas). 08/25/18   [provider]  dorzolamide -timolol  (COSOPT ) 2-0.5 % ophthalmic solution Place 1 drop into the right eye 2 (two) times daily. 10/27/18   [provider]  ezetimibe  (ZETIA ) 10 MG tablet Take 10 mg by mouth in the morning. 01/10/18   [provider]  hydrochlorothiazide  (MICROZIDE ) 12.5 MG capsule Take 1 capsule (12.5 mg total) by mouth daily. Patient taking differently: Take 12.5 mg by mouth in the morning. 01/15/24   Ula Prentice SAUNDERS, MD  losartan  (COZAAR ) 100 MG tablet Take 100 mg by mouth at  bedtime. 11/16/17   [provider]  Olopatadine  HCl (PATADAY  OP) Place 1 drop into both eyes as needed (dryness).    [provider]  ROCKLATAN  0.02-0.005 % SOLN Place 1 drop into the right eye at bedtime.    [provider]    Family History Family History  Problem Relation Age of Onset   Hyperlipidemia Sister    Diabetes Brother    Hyperlipidemia Brother    Diabetes Maternal Grandmother    Stroke Maternal Grandfather    Diabetes Brother    Breast cancer Neg Hx     Social History Social History   Tobacco Use   Smoking status: Never   Smokeless tobacco: Never  Vaping  Use   Vaping status: Never Used  Substance Use Topics   Alcohol use: No   Drug use: No     Allergies   Patient has no known allergies.   Review of Systems Review of Systems  Constitutional:  Negative for activity change, appetite change, fatigue and fever.  Eyes:  Negative for visual disturbance.  Respiratory:  Negative for shortness of breath.   Cardiovascular:  Negative for chest pain, palpitations and leg swelling.  Gastrointestinal:  Negative for nausea and vomiting.  Neurological:  Negative for dizziness, syncope, light-headedness and headaches.     Physical Exam Triage Vital Signs ED Triage Vitals  Encounter Vitals Group     BP 05/26/24 1852 (!) 216/88     Girls Systolic BP Percentile --      Girls Diastolic BP Percentile --      Boys Systolic BP Percentile --      Boys Diastolic BP Percentile --      Pulse Rate 05/26/24 1852 75     Resp 05/26/24 1852 20     Temp 05/26/24 1852 98.7 F (37.1 C)     Temp Source 05/26/24 1852 Oral     SpO2 05/26/24 1852 96 %     Weight --      Height --      Head Circumference --      Peak Flow --      Pain Score 05/26/24 1850 0     Pain Loc --      Pain Education --      Exclude from Growth Chart --    No data found.  Updated Vital Signs BP (!) 208/90 (BP Location: Left Arm)   Pulse 75   Temp 98.7 F (37.1 C) (Oral)   Resp 20   SpO2 96%   Visual Acuity Right Eye Distance:   Left Eye Distance:   Bilateral Distance:    Right Eye Near:   Left Eye Near:    Bilateral Near:     Physical Exam Vitals reviewed.  Constitutional:      General: She is awake. She is not in acute distress.    Appearance: Normal appearance. She is well-developed. She is not ill-appearing.     Comments: Very pleasant female appears stated age in no acute distress sitting comfortable in exam room  HENT:     Head: Normocephalic and atraumatic.     Mouth/Throat:     Pharynx: Uvula midline. No oropharyngeal exudate or posterior oropharyngeal  erythema.  Eyes:     Extraocular Movements: Extraocular movements intact.     Pupils: Pupils are equal, round, and reactive to light.  Cardiovascular:     Rate and Rhythm: Normal rate and regular rhythm.     Heart sounds: Normal heart sounds,  S1 normal and S2 normal. No murmur heard. Pulmonary:     Effort: Pulmonary effort is normal.     Breath sounds: Normal breath sounds. No wheezing, rhonchi or rales.     Comments: Clear to auscultation bilaterally Psychiatric:        Behavior: Behavior is cooperative.      UC Treatments / Results  Labs (all labs ordered are listed, but only abnormal results are displayed) Labs Reviewed - No data to display  EKG   Radiology No results found.  Procedures Procedures (including critical care time)  Medications Ordered in UC Medications - No data to display  Initial Impression / Assessment and Plan / UC Course  I have reviewed the triage vital signs and the nursing notes.  Pertinent labs & imaging results that were available during my care of the patient were reviewed by me and considered in my medical decision making (see chart for details).     Patient's with pressure was very elevated and we discussed that given she had an episode of feeling woozy earlier in the day with significantly elevated blood pressure and history of syncope and is reasonable to go to the emergency room.  Patient declined going to the emergency room and believes that she just needs to restart her medication.  She was agreeable to completing AMA form (see below) which was witnessed by [employer], CMA.  I requested basic blood work to monitor kidney function and probably dose of medication and ensure that she does not have an acute kidney injury as well as an EKG but she declined this.  I will send in amlodipine  5 mg to help manage her blood pressure; she has previously had some leg swelling with this medication we discussed that that is a common side effect but that  she should keep her legs elevated.  I am hesitant to use something else that requires dose adjustment based on her renal function since she did not want to get any blood work today.  Recommend that she use it until she is able to see her primary care first thing next week and we discussed the importance of following up with her PCP or our clinic within a few days.  She does live alone but I brought back her daughter-in-law who accompanied her to the visit but was out in the lobby who reported that she and her husband (patient's son) will spend the night with her.  We discussed that she should have a low very low threshold for going to the emergency room if she develops any kind of headache, chest pain, shortness of breath, vision change, lightheadedness, syncopal episode she should go to the ER immediately.  All questions were answered to patient and caregiver satisfaction and they expressed understanding and agreement with treatment plan.    The patient has been advised that they should be evaluated in the emergency department due to concern for hypertensive urgency.   I explained that the emergency room can provide resources not available in the urgent care. They have refused evaluation in the emergency room and declined referral or arrangement of transportation to the emergency room at this time and want to be discharged.  The patient was treated to the extent that they would allow.  The patient had the opportunity to ask questions about their medical condition.   The benefits of seeking and risks of not seeking emergency room evaluation were discussed and explained to the patient including but not limited to, worsening of  their condition, chronic pain, permanent disability, and death. The patient has decision-making capacity and voiced that they understand the information and the potential consequences of leaving Against Medical Advice. The patient did complete and sign an AMA form which was witnessed by  [facility] CMA .   Final Clinical Impressions(s) / UC Diagnoses   Final diagnoses:  Elevated blood pressure reading with diagnosis of hypertension     Discharge Instructions      Your blood pressure is very elevated.  As we discussed the safest thing to do was to go to the emergency room.  Since we are not doing that, I would like to restart your medication (amlodipine  5 mg daily).  Monitor your blood pressure at home.  Keep your legs elevated is much as possible.  Avoid decongestants, caffeine, sodium, NSAIDs (aspirin , ibuprofen /Advil , naproxen /Aleve ).  I would like you to follow-up with your primary care first thing Monday and if you are unable to see them in this timeframe please return here.  Please have your family member stay with you as we discussed and if you develop any additional symptoms including chest pain, shortness of breath, headache, vision change, dizziness in the setting of high blood pressure you need to go to the emergency room immediately.     ED Prescriptions     Medication Sig Dispense Auth. Provider   amLODipine  (NORVASC ) 2.5 MG tablet  (Status: Discontinued) Take 1 tablet (2.5 mg total) by mouth daily. 30 tablet Takima Encina K, PA-C   amLODipine  (NORVASC ) 5 MG tablet Take 1 tablet (5 mg total) by mouth daily. 30 tablet Toma Arts K, PA-C      PDMP not reviewed this encounter.   Sherrell Rocky POUR, PA-C 05/26/24 1934

## 2024-05-26 NOTE — ED Triage Notes (Signed)
 Pt states her b/p was 200/100 today. States is always high but not this high. States called her PCP and couldn't get in. States she was taken off her b/p meds in August. States has a slight headache.

## 2024-05-26 NOTE — Discharge Instructions (Addendum)
 Your blood pressure is very elevated.  As we discussed the safest thing to do was to go to the emergency room.  Since we are not doing that, I would like to restart your medication (amlodipine  5 mg daily).  Monitor your blood pressure at home.  Keep your legs elevated is much as possible.  Avoid decongestants, caffeine, sodium, NSAIDs (aspirin , ibuprofen /Advil , naproxen /Aleve ).  I would like you to follow-up with your primary care first thing Monday and if you are unable to see them in this timeframe please return here.  Please have your family member stay with you as we discussed and if you develop any additional symptoms including chest pain, shortness of breath, headache, vision change, dizziness in the setting of high blood pressure you need to go to the emergency room immediately.

## 2024-07-03 ENCOUNTER — Encounter: Payer: Self-pay | Admitting: Emergency Medicine

## 2024-07-03 ENCOUNTER — Ambulatory Visit
Admission: EM | Admit: 2024-07-03 | Discharge: 2024-07-03 | Disposition: A | Discharge status: Home / Self Care | Attending: Family Medicine | Admitting: Family Medicine

## 2024-07-03 ENCOUNTER — Ambulatory Visit (INDEPENDENT_AMBULATORY_CARE_PROVIDER_SITE_OTHER)

## 2024-07-03 DIAGNOSIS — M25512 Pain in left shoulder: Secondary | ICD-10-CM

## 2024-07-03 DIAGNOSIS — K921 Melena: Secondary | ICD-10-CM | POA: Insufficient documentation

## 2024-07-03 DIAGNOSIS — Z860100 Personal history of colon polyps, unspecified: Secondary | ICD-10-CM | POA: Insufficient documentation

## 2024-07-03 NOTE — ED Triage Notes (Addendum)
 Pt reports fall that occurred on 12/24 with ongoing L shoulder pain. Pt is here with family member who reports fall occurred after she got out of the car - it looked like her foot got caught up under her cane and she fell backward onto the pavement landing on her back. Pt reports padding her head with her hand when she felt herself fall back and keeping her head raised up. States her head made no contact with the ground. Pt is not on blood thinners. Notes constant pain in L shoulder since fall - describes dull to sharp pain depending on movement. Pt has not tried to raise her arm since the fall due to pain. Not taking any medications for the pain since fall. Has seen no other medical providers. Applied voltaren  gel to area with no relief.

## 2024-07-03 NOTE — Discharge Instructions (Signed)
 By my review there are no broken bones on the x-rays.  There is some arthritis.  The radiologist will also read your x-ray, and if their interpretation differs significantly from mine, and the management of your condition would change, we will call you.  Tylenol /acetaminophen  500 mg--take 2 tablets every 6 hours as needed for pain or fever.  Please follow-up with your primary care.

## 2024-07-03 NOTE — ED Provider Notes (Addendum)
 " [facility]    CSN: [id number] Arrival date & time: 07/03/24  1431      History   Chief Complaint Chief Complaint  Patient presents with   Fall   Shoulder Pain    HPI [name] is a 89 y.o. female.    Fall  Shoulder Pain  Here for left shoulder pain.  On December 24 she fell, possibly tripping on her cane.  She fell back onto her left back and shoulder.  She states with confidence that she did not hit her head.  No loss of consciousness.  Her left shoulder has continued to bother her since then.  She has not been taking any medication for it because she understood that any over-the-counter medications for pain would need to be approved by her doctor since she has hypertension.  No numbness or tingling  NKDA  Last eGFR was 54.  Past Medical History:  Diagnosis Date   Anemia    Breast cancer (HCC) 2002   Right   Cataract    Diabetes mellitus without complication (HCC)    Glaucoma    High cholesterol    Hypertension    Influenza A with pneumonia 09/25/2023   Personal history of chemotherapy    Personal history of radiation therapy     Patient Active Problem List   Diagnosis Date Noted   Hematochezia 07/03/2024   History of colonic polyps 07/03/2024   Syncope and collapse 03/01/2024   Acute gallstone pancreatitis 09/25/2023   Abnormal LFTs (liver function tests) 09/25/2023   Essential hypertension 09/25/2023   Aortic atherosclerosis 09/25/2023   Abdominal pain 09/25/2023   Hyperlipidemia 09/25/2023   Primary open angle glaucoma (POAG) of right eye, moderate stage 02/23/2018   Primary open angle glaucoma (POAG) of left eye, severe stage 02/23/2018   Pseudophakia of both eyes 01/26/2018   Lower abdominal pain 10/21/2016   Chronic hip pain 10/21/2016   Syncope 01/27/2014    Past Surgical History:  Procedure Laterality Date   ABDOMINAL HYSTERECTOMY     APPENDECTOMY     BREAST LUMPECTOMY Right 2001   EYE SURGERY      OB History    No obstetric history on file.      Home Medications    Prior to Admission medications  Medication Sig Start Date End Date Taking? Authorizing Provider  amLODipine  (NORVASC ) 5 MG tablet Take 1 tablet (5 mg total) by mouth daily. 05/26/24  Yes Raspet, Erin K, PA-C  brimonidine  (ALPHAGAN ) 0.2 % ophthalmic solution Place 1 drop into the right eye 2 (two) times daily.   Yes [provider]  diclofenac  Sodium (VOLTAREN ) 1 % GEL Apply 2 g topically 4 (four) times daily as needed (for pain- affected areas). 08/25/18  Yes [provider]  dorzolamide -timolol  (COSOPT ) 2-0.5 % ophthalmic solution Place 1 drop into the right eye 2 (two) times daily. 10/27/18  Yes [provider]  ezetimibe  (ZETIA ) 10 MG tablet Take 10 mg by mouth in the morning. 01/10/18  Yes [provider]  furosemide (LASIX) 20 MG tablet Take 20 mg by mouth daily as needed. Patient not taking: Reported on 07/03/2024 04/06/24   [provider]  [Paused] hydrochlorothiazide  (MICROZIDE ) 12.5 MG capsule Take 1 capsule (12.5 mg total) by mouth daily. Patient taking differently: Take 12.5 mg by mouth in the morning. Wait to take this until your doctor or other care provider tells you to start again. 01/15/24   Ula Prentice SAUNDERS, MD  [Paused] losartan  (  COZAAR ) 100 MG tablet Take 100 mg by mouth at bedtime. Wait to take this until your doctor or other care provider tells you to start again. 11/16/17   [provider]  Olopatadine  HCl (PATADAY  OP) Place 1 drop into both eyes as needed (dryness). Patient not taking: Reported on 07/03/2024    [provider]  pilocarpine (PILOCAR) 1 % ophthalmic solution Place 1 drop into the right eye 2 (two) times daily. Patient not taking: Reported on 07/03/2024 06/07/24   [provider]  prednisoLONE acetate (PRED FORTE) 1 % ophthalmic suspension Place 1 drop into the left eye 3 (three) times daily. Patient not taking: Reported on 07/03/2024  03/20/24   [provider]  ROCKLATAN  0.02-0.005 % SOLN Place 1 drop into the right eye at bedtime.    [provider]    Family History Family History  Problem Relation Age of Onset   Hyperlipidemia Sister    Diabetes Brother    Hyperlipidemia Brother    Diabetes Maternal Grandmother    Stroke Maternal Grandfather    Diabetes Brother    Breast cancer Neg Hx     Social History Social History[1]   Allergies   Patient has no known allergies.   Review of Systems Review of Systems   Physical Exam Triage Vital Signs ED Triage Vitals  Encounter Vitals Group     BP 07/03/24 1520 131/66     Girls Systolic BP Percentile --      Girls Diastolic BP Percentile --      Boys Systolic BP Percentile --      Boys Diastolic BP Percentile --      Pulse Rate 07/03/24 1520 77     Resp 07/03/24 1520 16     Temp 07/03/24 1520 98.5 F (36.9 C)     Temp Source 07/03/24 1520 Oral     SpO2 07/03/24 1520 96 %     Weight --      Height --      Head Circumference --      Peak Flow --      Pain Score 07/03/24 1516 7     Pain Loc --      Pain Education --      Exclude from Growth Chart --    No data found.  Updated Vital Signs BP 131/66 (BP Location: Left Arm)   Pulse 77   Temp 98.5 F (36.9 C) (Oral)   Resp 16   SpO2 96%   Visual Acuity Right Eye Distance:   Left Eye Distance:   Bilateral Distance:    Right Eye Near:   Left Eye Near:    Bilateral Near:     Physical Exam Vitals reviewed.  Constitutional:      General: She is not in acute distress.    Appearance: She is not ill-appearing, toxic-appearing or diaphoretic.  Cardiovascular:     Rate and Rhythm: Normal rate and regular rhythm.     Heart sounds: No murmur heard. Pulmonary:     Effort: Pulmonary effort is normal.     Breath sounds: Normal breath sounds.  Musculoskeletal:     Comments: Range of motion is limited by pain, but she is able to abduct her left arm to about 90 degrees.  Sensation  is intact.  Pulses are normal in the left arm.    Skin:    Coloration: Skin is not jaundiced or pale.  Neurological:     General: No focal deficit present.  Mental Status: She is alert and oriented to person, place, and time.  Psychiatric:        Behavior: Behavior normal.      UC Treatments / Results  Labs (all labs ordered are listed, but only abnormal results are displayed) Labs Reviewed - No data to display  EKG   Radiology DG Shoulder Left Result Date: 07/03/2024 CLINICAL DATA:  Acute pain of left shoulder.  Fall 5 days ago. EXAM: LEFT SHOULDER - 2+ VIEW COMPARISON:  None Available. FINDINGS: There is no evidence of fracture or dislocation. Acromioclavicular and glenohumeral degenerative change. Subcortical cystic change in the lateral humeral head. Soft tissues are unremarkable. IMPRESSION: 1. No fracture or dislocation of the left shoulder. 2. Acromioclavicular and glenohumeral degenerative change. Electronically Signed   By: Andrea Gasman M.D.   On: 07/03/2024 18:04    Procedures Procedures (including critical care time)  Medications Ordered in UC Medications - No data to display  Initial Impression / Assessment and Plan / UC Course  I have reviewed the triage vital signs and the nursing notes.  Pertinent labs & imaging results that were available during my care of the patient were reviewed by me and considered in my medical decision making (see chart for details).     By my review there are no fractures on the x-rays.  There is some sign of degenerative change.  I am recommending Tylenol  as needed for pain.  Shoulder sling is applied here in the clinic.  She is given contact information for orthopedics   Final Clinical Impressions(s) / UC Diagnoses   Final diagnoses:  Acute pain of left shoulder     Discharge Instructions      By my review there are no broken bones on the x-rays.  There is some arthritis.  The radiologist will also read your  x-ray, and if their interpretation differs significantly from mine, and the management of your condition would change, we will call you.  Tylenol /acetaminophen  500 mg--take 2 tablets every 6 hours as needed for pain or fever.  Please follow-up with your primary care.     ED Prescriptions   None    PDMP not reviewed this encounter.    Vonna Sharlet POUR, MD 07/03/24 1656     [1]  Social History Tobacco Use   Smoking status: Never   Smokeless tobacco: Never  Vaping Use   Vaping status: Never Used  Substance Use Topics   Alcohol use: No   Drug use: No     Vonna Sharlet POUR, MD 07/03/24 2057  "

## 2024-07-04 ENCOUNTER — Ambulatory Visit (HOSPITAL_COMMUNITY): Payer: Self-pay
